# Patient Record
Sex: Female | Born: 1965 | Hispanic: No | Marital: Married | Smoking: Never smoker
Health system: Southern US, Community
[De-identification: ages and names within clinical notes are randomized; demographics above are authoritative.]

## PROBLEM LIST (undated history)

## (undated) DIAGNOSIS — C4490 Unspecified malignant neoplasm of skin, unspecified: Secondary | ICD-10-CM

## (undated) DIAGNOSIS — D649 Anemia, unspecified: Secondary | ICD-10-CM

## (undated) DIAGNOSIS — G43909 Migraine, unspecified, not intractable, without status migrainosus: Secondary | ICD-10-CM

## (undated) HISTORY — PX: WISDOM TOOTH EXTRACTION: SHX21

## (undated) HISTORY — PX: CHOLECYSTECTOMY: SHX55

## (undated) HISTORY — DX: Unspecified malignant neoplasm of skin, unspecified: C44.90

## (undated) HISTORY — DX: Anemia, unspecified: D64.9

---

## 1997-10-31 ENCOUNTER — Other Ambulatory Visit: Admission: RE | Admit: 1997-10-31 | Discharge: 1997-10-31 | Payer: Self-pay | Admitting: Obstetrics and Gynecology

## 1998-04-24 ENCOUNTER — Other Ambulatory Visit: Admission: RE | Admit: 1998-04-24 | Discharge: 1998-04-24 | Payer: Self-pay | Admitting: Obstetrics and Gynecology

## 1998-12-28 ENCOUNTER — Other Ambulatory Visit: Admission: RE | Admit: 1998-12-28 | Discharge: 1998-12-28 | Payer: Self-pay | Admitting: Obstetrics and Gynecology

## 1999-10-26 ENCOUNTER — Encounter: Payer: Self-pay | Admitting: Emergency Medicine

## 1999-10-26 ENCOUNTER — Emergency Department (HOSPITAL_COMMUNITY): Admission: EM | Admit: 1999-10-26 | Discharge: 1999-10-26 | Payer: Self-pay | Admitting: Emergency Medicine

## 2000-01-29 ENCOUNTER — Other Ambulatory Visit: Admission: RE | Admit: 2000-01-29 | Discharge: 2000-01-29 | Payer: Self-pay | Admitting: Obstetrics and Gynecology

## 2001-03-19 ENCOUNTER — Other Ambulatory Visit: Admission: RE | Admit: 2001-03-19 | Discharge: 2001-03-19 | Payer: Self-pay | Admitting: Obstetrics and Gynecology

## 2002-06-02 ENCOUNTER — Other Ambulatory Visit: Admission: RE | Admit: 2002-06-02 | Discharge: 2002-06-02 | Payer: Self-pay | Admitting: Obstetrics and Gynecology

## 2003-06-14 ENCOUNTER — Other Ambulatory Visit: Admission: RE | Admit: 2003-06-14 | Discharge: 2003-06-14 | Payer: Self-pay | Admitting: Obstetrics and Gynecology

## 2004-07-13 ENCOUNTER — Other Ambulatory Visit: Admission: RE | Admit: 2004-07-13 | Discharge: 2004-07-13 | Payer: Self-pay | Admitting: Obstetrics and Gynecology

## 2006-08-21 ENCOUNTER — Encounter: Admission: RE | Admit: 2006-08-21 | Discharge: 2006-08-21 | Payer: Self-pay | Admitting: Internal Medicine

## 2014-03-25 ENCOUNTER — Emergency Department (HOSPITAL_BASED_OUTPATIENT_CLINIC_OR_DEPARTMENT_OTHER)
Admission: EM | Admit: 2014-03-25 | Discharge: 2014-03-25 | Disposition: A | Discharge status: Home / Self Care | Payer: BLUE CROSS/BLUE SHIELD | Attending: Emergency Medicine | Admitting: Emergency Medicine

## 2014-03-25 ENCOUNTER — Encounter (HOSPITAL_BASED_OUTPATIENT_CLINIC_OR_DEPARTMENT_OTHER): Payer: Self-pay | Admitting: *Deleted

## 2014-03-25 DIAGNOSIS — R51 Headache: Secondary | ICD-10-CM | POA: Diagnosis present

## 2014-03-25 DIAGNOSIS — B029 Zoster without complications: Secondary | ICD-10-CM

## 2014-03-25 HISTORY — DX: Migraine, unspecified, not intractable, without status migrainosus: G43.909

## 2014-03-25 MED ORDER — OXYCODONE-ACETAMINOPHEN 5-325 MG PO TABS: 1.0000 | ORAL_TABLET | Freq: Four times a day (QID) | ORAL | Status: DC | PRN

## 2014-03-25 NOTE — Discharge Instructions (Signed)
Percocet as prescribed as needed for pain.  Continue your steroid and valacyclovir as prescribed.  You can stop taking the clindamycin.  Return to the emergency department if your symptoms significantly worsen or change.   Shingles Shingles (herpes zoster) is an infection that is caused by the same virus that causes chickenpox (varicella). The infection causes a painful skin rash and fluid-filled blisters, which eventually break open, crust over, and heal. It may occur in any area of the body, but it usually affects only one side of the body or face. The pain of shingles usually lasts about 1 month. However, some people with shingles may develop long-term (chronic) pain in the affected area of the body. Shingles often occurs many years after the person had chickenpox. It is more common:  In people older than 50 years.  In people with weakened immune systems, such as those with HIV, AIDS, or cancer.  In people taking medicines that weaken the immune system, such as transplant medicines.  In people under great stress. CAUSES  Shingles is caused by the varicella zoster virus (VZV), which also causes chickenpox. After a person is infected with the virus, it can remain in the person's body for years in an inactive state (dormant). To cause shingles, the virus reactivates and breaks out as an infection in a nerve root. The virus can be spread from person to person (contagious) through contact with open blisters of the shingles rash. It will only spread to people who have not had chickenpox. When these people are exposed to the virus, they may develop chickenpox. They will not develop shingles. Once the blisters scab over, the person is no longer contagious and cannot spread the virus to others. SIGNS AND SYMPTOMS  Shingles shows up in stages. The initial symptoms may be pain, itching, and tingling in an area of the skin. This pain is usually described as burning, stabbing, or throbbing.In a few days  or weeks, a painful red rash will appear in the area where the pain, itching, and tingling were felt. The rash is usually on one side of the body in a band or belt-like pattern. Then, the rash usually turns into fluid-filled blisters. They will scab over and dry up in approximately 2-3 weeks. Flu-like symptoms may also occur with the initial symptoms, the rash, or the blisters. These may include:  Fever.  Chills.  Headache.  Upset stomach. DIAGNOSIS  Your health care provider will perform a skin exam to diagnose shingles. Skin scrapings or fluid samples may also be taken from the blisters. This sample will be examined under a microscope or sent to a lab for further testing. TREATMENT  There is no specific cure for shingles. Your health care provider will likely prescribe medicines to help you manage the pain, recover faster, and avoid long-term problems. This may include antiviral drugs, anti-inflammatory drugs, and pain medicines. HOME CARE INSTRUCTIONS   Take a cool bath or apply cool compresses to the area of the rash or blisters as directed. This may help with the pain and itching.   Take medicines only as directed by your health care provider.   Rest as directed by your health care provider.  Keep your rash and blisters clean with mild soap and cool water or as directed by your health care provider.  Do not pick your blisters or scratch your rash. Apply an anti-itch cream or numbing creams to the affected area as directed by your health care provider.  Keep your shingles rash  covered with a loose bandage (dressing).  Avoid skin contact with:  Babies.   Pregnant women.   Children with eczema.   Elderly people with transplants.   People with chronic illnesses, such as leukemia or AIDS.   Wear loose-fitting clothing to help ease the pain of material rubbing against the rash.  Keep all follow-up visits as directed by your health care provider.If the area involved  is on your face, you may receive a referral for a specialist, such as an eye doctor (ophthalmologist) or an ear, nose, and throat (ENT) doctor. Keeping all follow-up visits will help you avoid eye problems, chronic pain, or disability.  SEEK IMMEDIATE MEDICAL CARE IF:   You have facial pain, pain around the eye area, or loss of feeling on one side of your face.  You have ear pain or ringing in your ear.  You have loss of taste.  Your pain is not relieved with prescribed medicines.   Your redness or swelling spreads.   You have more pain and swelling.  Your condition is worsening or has changed.   You have a fever. MAKE SURE YOU:  Understand these instructions.  Will watch your condition.  Will get help right away if you are not doing well or get worse. Document Released: 02/25/2005 Document Revised: 07/12/2013 Document Reviewed: 10/10/2011 [facility] Patient Information 2015 [place], [place]. This information is not intended to replace advice given to you by your health care provider. Make sure you discuss any questions you have with your health care provider.

## 2014-03-25 NOTE — ED Provider Notes (Signed)
CSN: [id number]     Arrival date & time 03/25/14  9480 History   First MD Initiated Contact with Patient 03/25/14 [phone]     Chief Complaint  Patient presents with  . Facial Pain     (Consider location/radiation/quality/duration/timing/severity/associated sxs/prior Treatment) HPI Comments: Patient is a 49 year old female, otherwise healthy who presents with complaints of right facial and temporal pain. She was seen by her ENT 3 days ago and was diagnosed with shingles due to a rash to her right temple. She was prescribed prednisone, Valtrex, and pain medication. She presents here due to ongoing pain that is unrelieved with the pain medication she was prescribed. She denies any fevers or chills. She denies any visual disturbances. She denies any nausea, vomiting.  The history is provided by the patient.    No past medical history on file. No past surgical history on file. No family history on file. History  Substance Use Topics  . Smoking status: Never Smoker   . Smokeless tobacco: Never Used  . Alcohol Use: No   OB History    No data available     Review of Systems  All other systems reviewed and are negative.     Allergies  Review of patient's allergies indicates not on file.  Home Medications   Prior to Admission medications   Not on File   BP 123/80 mmHg  Pulse 81  Temp(Src) 98.3 F (36.8 C) (Oral)  Resp 18  Ht 5\' 6"  (1.676 m)  Wt 112 lb (50.803 kg)  BMI 18.09 kg/m2  SpO2 98% Physical Exam  Constitutional: She is oriented to person, place, and time. She appears well-developed and well-nourished. No distress.  HENT:  Head: Normocephalic and atraumatic.  Right Ear: External ear normal.  Left Ear: External ear normal.  Mouth/Throat: Oropharynx is clear and moist.  There is a vesicular rash present to the right zygoma and lateral to the right eye, but not involving the eye.  Eyes: EOM are normal. Pupils are equal, round, and reactive to light.  Neck: Normal  range of motion. Neck supple.  Musculoskeletal: Normal range of motion. She exhibits no edema.  Lymphadenopathy:    She has no cervical adenopathy.  Neurological: She is alert and oriented to person, place, and time. No cranial nerve deficit. She exhibits normal muscle tone. Coordination normal.  Skin: Skin is warm and dry. She is not diaphoretic.  Nursing note and vitals reviewed.   ED Course  Procedures (including critical care time) Labs Review Labs Reviewed - No data to display  Imaging Review No results found.   EKG Interpretation None      MDM   Final diagnoses:  None    As the patient's hydrocodone is not relieving her pain, I will prescribe Percocet which she can take as needed. She is to continue the Valtrex and prednisone. She was prescribed clindamycin which I believe she can stop as this is does not appear to be a cellulitis or dental in nature.    Veryl Speak, MD 03/25/14 [phone]

## 2014-03-25 NOTE — ED Notes (Signed)
Rx x 1 given for percocet

## 2014-03-25 NOTE — ED Notes (Addendum)
Facial pain since Monday- saw dentist and ENT- dx with shingles and started on multiple meds- slight rash by right eye and on chin

## 2014-03-25 NOTE — ED Notes (Signed)
MD at bedside. 

## 2015-06-02 ENCOUNTER — Encounter: Payer: Self-pay | Admitting: Internal Medicine

## 2015-07-20 ENCOUNTER — Ambulatory Visit (AMBULATORY_SURGERY_CENTER): Payer: Self-pay

## 2015-07-20 VITALS — Ht 65.5 in | Wt 116.4 lb

## 2015-07-20 DIAGNOSIS — Z1211 Encounter for screening for malignant neoplasm of colon: Secondary | ICD-10-CM

## 2015-07-20 NOTE — Progress Notes (Signed)
No allergies to eggs or soy No past problems with anesthesia No diet meds No home oxygen  Has email and internet registered for emmi 

## 2015-07-21 ENCOUNTER — Encounter: Payer: Self-pay | Admitting: Internal Medicine

## 2015-08-03 ENCOUNTER — Ambulatory Visit (AMBULATORY_SURGERY_CENTER): Payer: BLUE CROSS/BLUE SHIELD | Admitting: Internal Medicine

## 2015-08-03 ENCOUNTER — Encounter: Payer: Self-pay | Admitting: Internal Medicine

## 2015-08-03 VITALS — BP 110/70 | HR 57 | Temp 98.6°F | Resp 12 | Ht 65.0 in | Wt 116.0 lb

## 2015-08-03 DIAGNOSIS — Z1211 Encounter for screening for malignant neoplasm of colon: Secondary | ICD-10-CM | POA: Diagnosis not present

## 2015-08-03 MED ORDER — SODIUM CHLORIDE 0.9 % IV SOLN: 500.0000 mL | INTRAVENOUS | Status: DC

## 2015-08-03 NOTE — Op Note (Signed)
[place] Patient Name: Felicia Barron Procedure Date: 08/03/2015 9:27 AM MRN: [id number] Endoscopist: Gatha Mayer , MD Age: 50 Referring MD:  Date of Birth: 1966-02-05 Gender: Female Procedure:                Colonoscopy Indications:              Screening for colorectal malignant neoplasm, This                            is the patient's first colonoscopy Medicines:                Propofol per Anesthesia, Monitored Anesthesia Care Procedure:                Pre-Anesthesia Assessment:                           - Prior to the procedure, a History and Physical                            was performed, and patient medications and                            allergies were reviewed. The patient's tolerance of                            previous anesthesia was also reviewed. The risks                            and benefits of the procedure and the sedation                            options and risks were discussed with the patient.                            All questions were answered, and informed consent                            was obtained. Prior Anticoagulants: The patient has                            taken no previous anticoagulant or antiplatelet                            agents. ASA Grade Assessment: II - A patient with                            mild systemic disease. After reviewing the risks                            and benefits, the patient was deemed in                            satisfactory condition to undergo the procedure.  After obtaining informed consent, the colonoscope                            was passed under direct vision. Throughout the                            procedure, the patient's blood pressure, pulse, and                            oxygen saturations were monitored continuously. The                            Model CF-HQ190L [id number]) scope was introduced                            through the anus  and advanced to the the cecum,                            identified by appendiceal orifice and ileocecal                            valve. The ileocecal valve, appendiceal orifice,                            and rectum were photographed. The quality of the                            bowel preparation was good. The colonoscopy was                            performed without difficulty. The patient tolerated                            the procedure well. The bowel preparation used was                            Miralax. Scope In: 9:30:49 AM Scope Out: 9:43:35 AM Scope Withdrawal Time: 0 hours 8 minutes 14 seconds  Total Procedure Duration: 0 hours 12 minutes 46 seconds  Findings:                 The colon (entire examined portion) appeared normal.                           The perianal and digital rectal examinations were                            normal. Complications:            No immediate complications. Estimated blood loss:                            None. Estimated Blood Loss:     Estimated blood loss: none. Recommendation:           - Repeat colonoscopy in 10 years for screening  purposes.                           - Patient has a contact number available for                            emergencies. The signs and symptoms of potential                            delayed complications were discussed with the                            patient. Return to normal activities tomorrow.                            Written discharge instructions were provided to the                            patient.                           - Resume previous diet.                           - Continue present medications. Gatha Mayer, MD 08/03/2015 9:46:46 AM This report has been signed electronically.

## 2015-08-03 NOTE — Patient Instructions (Addendum)
   Your colonoscopy was normal!  Next routine colonoscopy in 10 years - 2027  I appreciate the opportunity to care for you. Gatha Mayer, MD, FACG  YOU HAD AN ENDOSCOPIC PROCEDURE TODAY AT [facility]:   Refer to the procedure report that was given to you for any specific questions about what was found during the examination.  If the procedure report does not answer your questions, please call your gastroenterologist to clarify.  If you requested that your care partner not be given the details of your procedure findings, then the procedure report has been included in a sealed envelope for you to review at your convenience later.  YOU SHOULD EXPECT: Some feelings of bloating in the abdomen. Passage of more gas than usual.  Walking can help get rid of the air that was put into your GI tract during the procedure and reduce the bloating. If you had a lower endoscopy (such as a colonoscopy or flexible sigmoidoscopy) you may notice spotting of blood in your stool or on the toilet paper. If you underwent a bowel prep for your procedure, you may not have a normal bowel movement for a few days.  Please Note:  You might notice some irritation and congestion in your nose or some drainage.  This is from the oxygen used during your procedure.  There is no need for concern and it should clear up in a day or so.  SYMPTOMS TO REPORT IMMEDIATELY:   Following lower endoscopy (colonoscopy or flexible sigmoidoscopy):  Excessive amounts of blood in the stool  Significant tenderness or worsening of abdominal pains  Swelling of the abdomen that is new, acute  Fever of 100F or higher  For urgent or emergent issues, a gastroenterologist can be reached at any hour by calling [phone].  DIET: Your first meal following the procedure should be a small meal and then it is ok to progress to your normal diet. Heavy or fried foods are harder to digest and may make you feel nauseous or  bloated.  Likewise, meals heavy in dairy and vegetables can increase bloating.  Drink plenty of fluids but you should avoid alcoholic beverages for 24 hours.  ACTIVITY:  You should plan to take it easy for the rest of today and you should NOT DRIVE or use heavy machinery until tomorrow (because of the sedation medicines used during the test).    FOLLOW UP: Our staff will call the number listed on your records the next business day following your procedure to check on you and address any questions or concerns that you may have regarding the information given to you following your procedure. If we do not reach you, we will leave a message.  However, if you are feeling well and you are not experiencing any problems, there is no need to return our call.  We will assume that you have returned to your regular daily activities without incident.  SIGNATURES/CONFIDENTIALITY: You and/or your care partner have signed paperwork which will be entered into your electronic medical record.  These signatures attest to the fact that that the information above on your After Visit Summary has been reviewed and is understood.  Full responsibility of the confidentiality of this discharge information lies with you and/or your care-partner.  Next colonoscopy- 10 years  Please continue your normal medications

## 2015-08-03 NOTE — Progress Notes (Signed)
Report to PACU, RN, vss, BBS= Clear.  

## 2015-08-04 ENCOUNTER — Telehealth: Payer: Self-pay | Admitting: *Deleted

## 2015-08-04 NOTE — Telephone Encounter (Signed)
  Follow up Call-  Call back number 08/03/2015  Post procedure Call Back phone  # [phone]  Permission to leave phone message Yes     Patient questions:  Do you have a fever, pain , or abdominal swelling? No. Pain Score  0 *  Have you tolerated food without any problems? Yes.    Have you been able to return to your normal activities? Yes.    Do you have any questions about your discharge instructions: Diet   No. Medications  No. Follow up visit  No.  Do you have questions or concerns about your Care? No.  Actions: * If pain score is 4 or above: No action needed, pain <4.

## 2015-10-02 ENCOUNTER — Other Ambulatory Visit: Payer: Self-pay | Admitting: Obstetrics and Gynecology

## 2015-10-02 DIAGNOSIS — R928 Other abnormal and inconclusive findings on diagnostic imaging of breast: Secondary | ICD-10-CM

## 2015-10-03 ENCOUNTER — Other Ambulatory Visit: Payer: Self-pay | Admitting: Obstetrics and Gynecology

## 2015-10-03 DIAGNOSIS — R928 Other abnormal and inconclusive findings on diagnostic imaging of breast: Secondary | ICD-10-CM

## 2015-10-09 ENCOUNTER — Ambulatory Visit
Admission: RE | Admit: 2015-10-09 | Discharge: 2015-10-09 | Disposition: A | Discharge status: Home / Self Care | Payer: BLUE CROSS/BLUE SHIELD | Source: Ambulatory Visit | Attending: Obstetrics and Gynecology | Admitting: Obstetrics and Gynecology

## 2015-10-09 DIAGNOSIS — R928 Other abnormal and inconclusive findings on diagnostic imaging of breast: Secondary | ICD-10-CM

## 2015-10-09 IMAGING — US ULTRASOUND RIGHT BREAST LIMITED
1 series · 13 of 14 positions shown · non-contrast
Comparison: Previous exam(s).

CLINICAL DATA: Callback for possible right breast asymmetries

EXAM:
2D DIGITAL DIAGNOSTIC RIGHT MAMMOGRAM WITH CAD AND ADJUNCT TOMO
ULTRASOUND RIGHT BREAST

[Series 1: ultrasound right breast limited · 0.03mm/px · 13 of 14 slices shown]
[im 1/14]
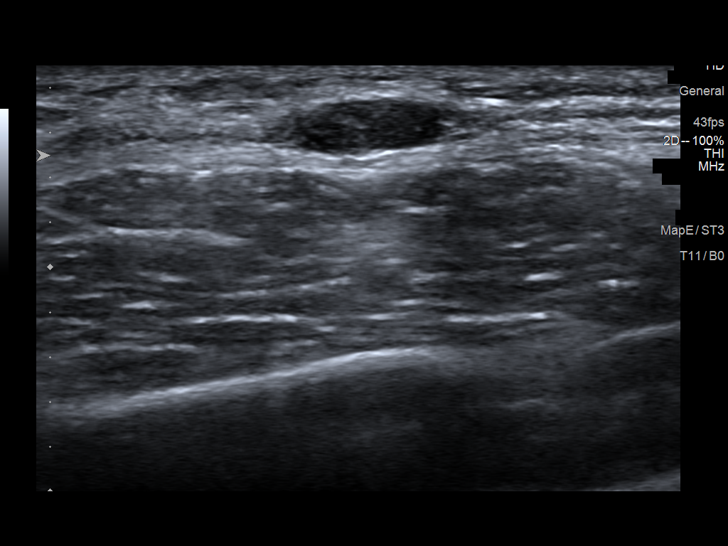
[im 2/14]
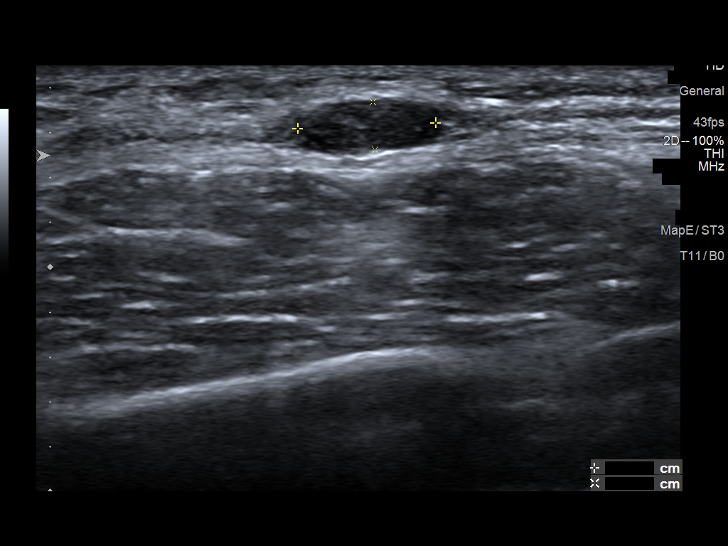
[im 3/14]
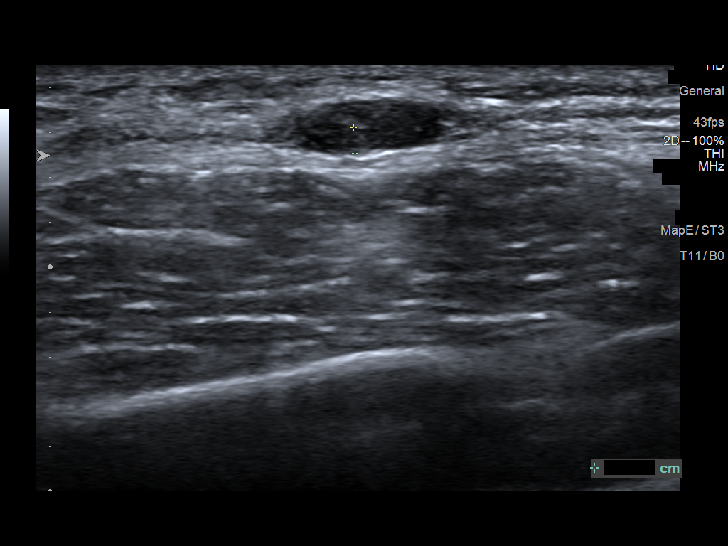
[im 4/14]
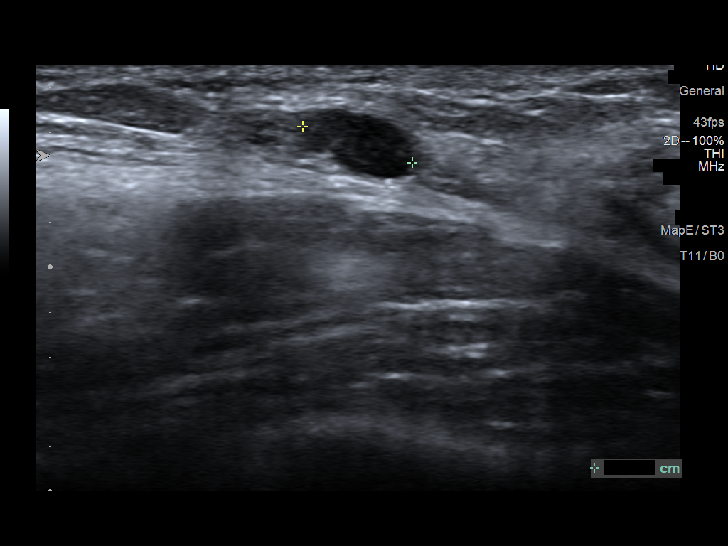
[im 5/14]
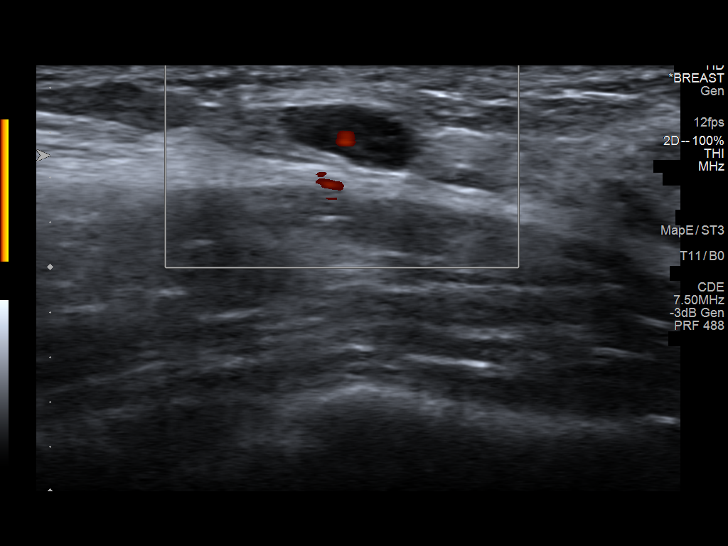
[im 6/14]
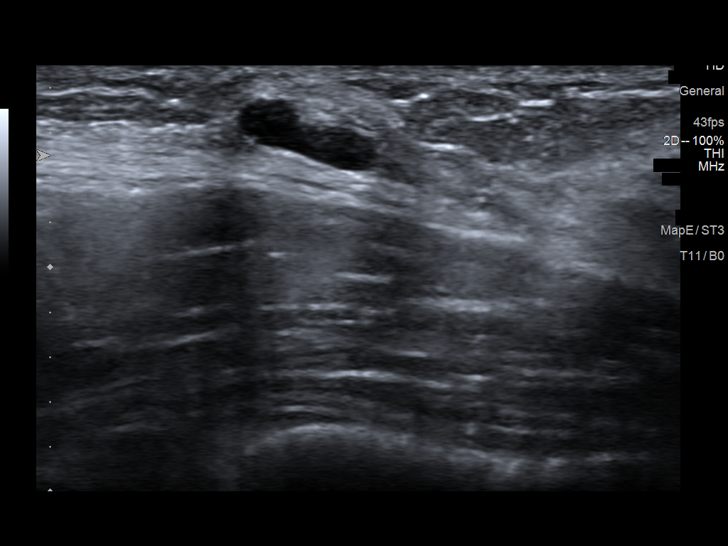
[im 8/14]
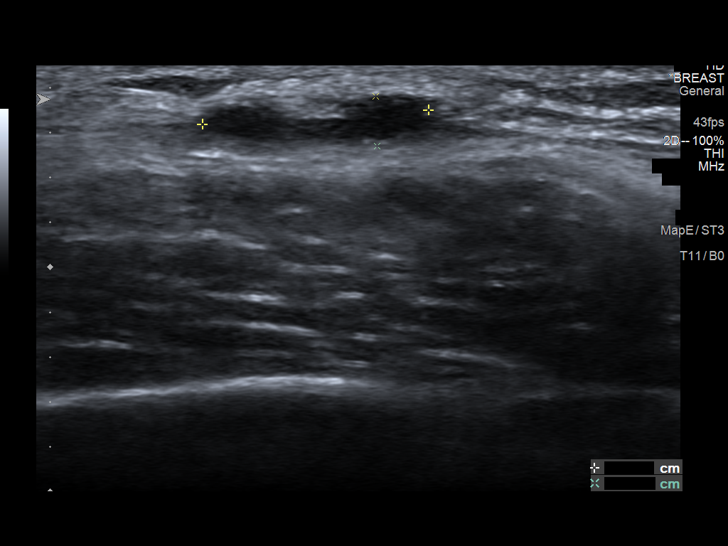
[im 9/14]
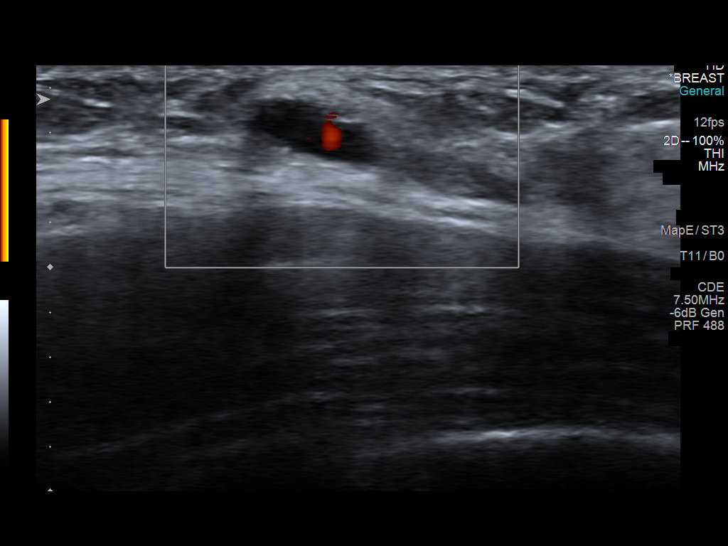
[im 10/14]
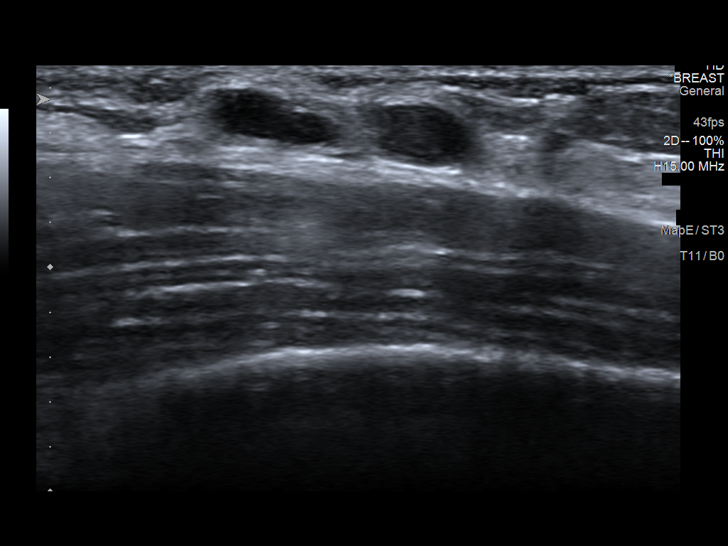
[im 11/14]
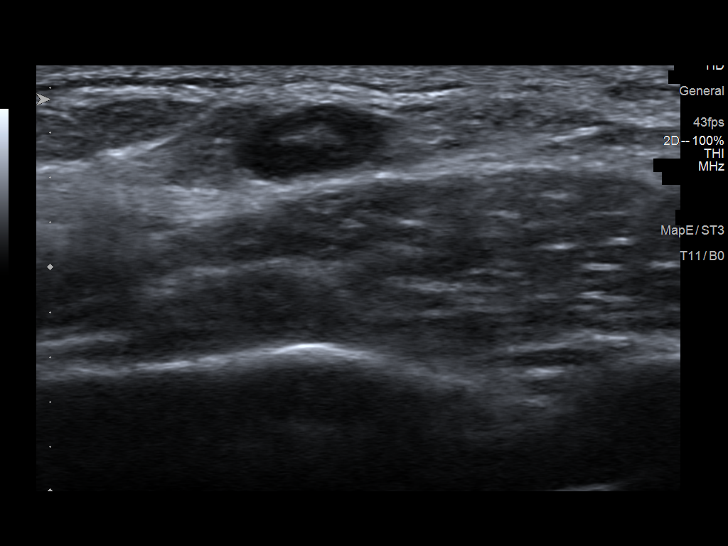
[im 12/14]
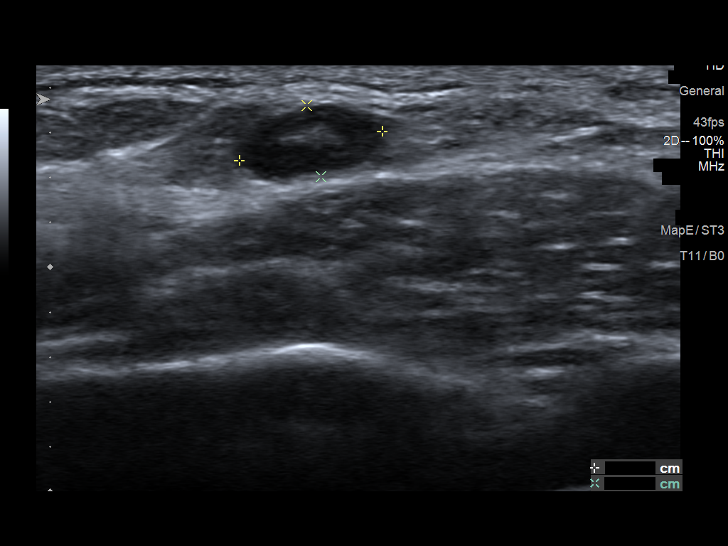
[im 13/14]
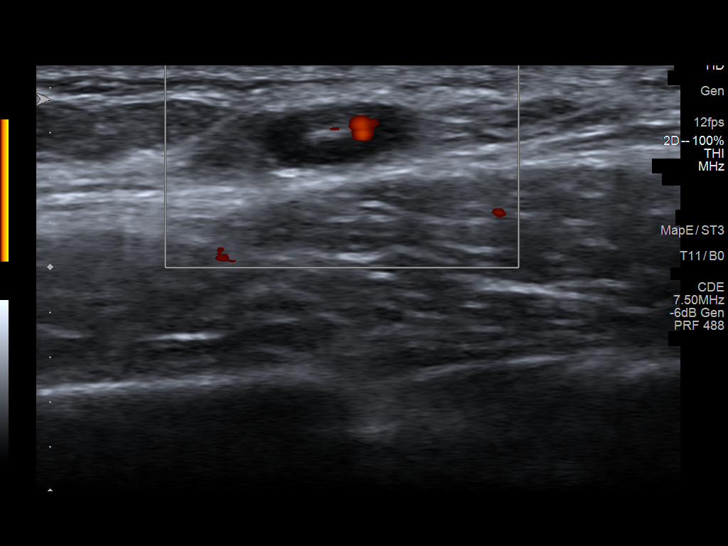
[im 14/14]
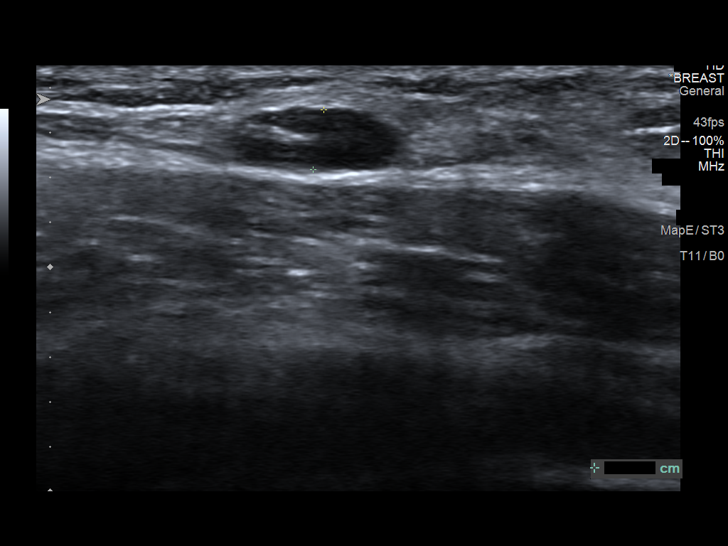

[13 of 14 positions shown; findings below may reference images not displayed]

ACR Breast Density Category d: The breast tissue is extremely dense,
which lowers the sensitivity of mammography.
FINDINGS: Cc, MLO, and spot-compression views of the right breast were
performed with tomosynthesis. On the additional views, there are 2
oval, circumscribed masses with the suggestion of central fatty hila
within the posterior, superior right breast. There is an additional
partially imaged oval mass within the posterior, superior right
breast, the visualized margins of which are circumscribed.

Mammographic images were processed with CAD.

Targeted ultrasound was performed demonstrating 3 normal appearing
intramammary lymph nodes including a 6 mm lymph node at 9 o'clock, 6
cm from the nipple, a second 10 mm lymph node at 9 o'clock, 6 cm
from the nipple, and a 7 mm lymph node at 10 o'clock, 7 cm from the
nipple. These lymph nodes correspond to the oval masses seen
mammographically. No suspicious sonographic finding is identified.
IMPRESSION: No mammographic or sonographic evidence of malignancy.

RECOMMENDATION:
Screening mammogram in one year.(Code:[PE])

I have discussed the findings and recommendations with the patient.
Results were also provided in writing at the conclusion of the
visit. If applicable, a reminder letter will be sent to the patient
regarding the next appointment.

BI-RADS CATEGORY  2: Benign.

## 2016-10-08 ENCOUNTER — Other Ambulatory Visit: Payer: Self-pay | Admitting: Obstetrics and Gynecology

## 2016-10-08 DIAGNOSIS — R928 Other abnormal and inconclusive findings on diagnostic imaging of breast: Secondary | ICD-10-CM

## 2016-10-15 ENCOUNTER — Ambulatory Visit
Admission: RE | Admit: 2016-10-15 | Discharge: 2016-10-15 | Disposition: A | Discharge status: Home / Self Care | Payer: Managed Care, Other (non HMO) | Source: Ambulatory Visit | Attending: Obstetrics and Gynecology | Admitting: Obstetrics and Gynecology

## 2016-10-15 DIAGNOSIS — R928 Other abnormal and inconclusive findings on diagnostic imaging of breast: Secondary | ICD-10-CM

## 2019-10-14 ENCOUNTER — Ambulatory Visit: Payer: BC Managed Care – PPO | Attending: Family Medicine

## 2019-10-14 ENCOUNTER — Other Ambulatory Visit: Payer: Self-pay

## 2019-10-14 DIAGNOSIS — M25612 Stiffness of left shoulder, not elsewhere classified: Secondary | ICD-10-CM | POA: Diagnosis present

## 2019-10-14 DIAGNOSIS — S46912A Strain of unspecified muscle, fascia and tendon at shoulder and upper arm level, left arm, initial encounter: Secondary | ICD-10-CM | POA: Diagnosis not present

## 2019-10-14 DIAGNOSIS — M6281 Muscle weakness (generalized): Secondary | ICD-10-CM | POA: Insufficient documentation

## 2019-10-14 DIAGNOSIS — M25512 Pain in left shoulder: Secondary | ICD-10-CM | POA: Diagnosis present

## 2019-10-14 NOTE — Therapy (Signed)
[place], [place], [address] Phone: [phone]   Fax:  [phone]  Physical Therapy Evaluation  Patient Details  Name: Felicia Barron MRN: [id number] Date of Birth: [birth year] Referring Provider (PT): Bernerd Limbo, MD   Encounter Date: 10/14/2019   PT End of Session - 10/14/19 0944    Visit Number 1    Number of Visits 12    Date for PT Re-Evaluation 11/25/19    Authorization Type BCBS    PT Start Time 0917    PT Stop Time 1002    PT Time Calculation (min) 45 min    Activity Tolerance Patient tolerated treatment well    Behavior During Therapy [facility] for tasks assessed/performed           Past Medical History:  Diagnosis Date  . Anemia   . Migraine   . Skin cancer     Past Surgical History:  Procedure Laterality Date  . CESAREAN SECTION    . CHOLECYSTECTOMY    . WISDOM TOOTH EXTRACTION      There were no vitals filed for this visit.    Subjective Assessment - 10/14/19 0928    Subjective Pt reports she initially noticed L shoulder aching 6 weeks ago and then improved. However, pt was helping husband with a roof bag for vacation on top of car. She then started aching again and mobility has continually worsened. MD gave pt CSI yesterday.    Pertinent History R frozen shoulder 2016    Limitations Lifting;House hold activities;Other (comment)   reaching behind back, overhead; seat belt, bra don/doff   Diagnostic tests XR - negative    Patient Stated Goals to put on my bra, to be able to perform basic ADLs dressing/grooming with no help from husband    Currently in Pain? Yes    Pain Score 0-No pain   at times 10/10   Pain Location Shoulder    Pain Orientation Left    Pain Radiating Towards elbow    Pain Onset 1 to 4 weeks ago    Pain Frequency Intermittent    Aggravating Factors  cannot don/doff bra, prolonged overhead like washing hair, dressing, grooming, wakes up 3-4x/night  with pain    Pain Relieving Factors Advil PM at night sleep              [facility] PT Assessment - 10/14/19 0001      Assessment   Medical Diagnosis Left Shoulder Strain    Referring Provider (PT) Bernerd Limbo, MD    Onset Date/Surgical Date 09/23/19    Hand Dominance Right    Next MD Visit PRN    Prior Therapy Yes 2016      Balance Screen   Has the patient fallen in the past 6 months No    Has the patient had a decrease in activity level because of a fear of falling?  Yes    Is the patient reluctant to leave their home because of a fear of falling?  No      Prior Function   Leisure Exercise, go to the beach, spend time with family, knit      ROM / Strength   AROM / PROM / Strength AROM;PROM;Strength      AROM   AROM Assessment Site Shoulder    Right/Left Shoulder Right;Left    Right Shoulder Flexion 145 Degrees    Right Shoulder ABduction 165 Degrees    Right Shoulder Internal  Rotation --   T8   Right Shoulder External Rotation --   T3   Left Shoulder Flexion 123 Degrees   pain   Left Shoulder ABduction 133 Degrees   pain   Left Shoulder Internal Rotation --   T12   Left Shoulder External Rotation --   T2     PROM   PROM Assessment Site Shoulder    Right/Left Shoulder Right;Left    Right Shoulder Flexion 150 Degrees    Right Shoulder ABduction 160 Degrees    Right Shoulder Internal Rotation 67 Degrees    Right Shoulder External Rotation 95 Degrees    Left Shoulder Flexion 135 Degrees   pain   Left Shoulder ABduction 98 Degrees    Left Shoulder Internal Rotation 55 Degrees    Left Shoulder External Rotation 50 Degrees      Strength   Strength Assessment Site Shoulder    Right/Left Shoulder Right;Left    Right Shoulder Flexion 4/5    Right Shoulder ABduction 4/5    Right Shoulder Internal Rotation 4/5    Right Shoulder External Rotation 3+/5    Left Shoulder Flexion 4-/5   pain   Left Shoulder ABduction 4-/5    Left Shoulder Internal Rotation 4/5    Left  Shoulder External Rotation 4-/5      Palpation   Palpation comment Pain with all ranging of L shoulder, decreased posterior and inferior GHJ glides                      Objective measurements completed on examination: See above findings.               PT Education - 10/14/19 1331    Education Details Diagnosis, Prognosis, POC, HEP    Person(s) Educated Patient    Methods Explanation;Handout    Comprehension Returned demonstration;Verbalized understanding            PT Short Term Goals - 10/14/19 1350      PT SHORT TERM GOAL #1   Title Pt will be independent and compliant with initial HEP.    Time 1    Period Weeks    Status New    Target Date 10/21/19      PT SHORT TERM GOAL #2   Title Pt will increase shoulder flexion/abduction ROM at least 10 each.    Time 3    Period Weeks    Status New    Target Date 11/04/19      PT SHORT TERM GOAL #3   Title Pt will only wake up 1-2x/night secondary to L shoulder pain.    Baseline 3-4x    Time 3    Period Weeks    Status New    Target Date 11/04/19             PT Long Term Goals - 10/14/19 1351      PT LONG TERM GOAL #1   Title Pt will decrease FOTO disability to 37%, demonstrating a significant change in perceived functional ability.    Baseline 66% disability    Time 6    Period Weeks    Status New    Target Date 11/25/19      PT LONG TERM GOAL #2   Title Pt will increase true flexion/abduction ROM to >130 with <2/10 pain at most.    Time 6    Period Weeks    Status New    Target Date 11/25/19  PT LONG TERM GOAL #3   Title Pt will increase L shoulder MMT to 4+/5 without pain.    Time 6    Period Weeks    Status New    Target Date 11/25/19      PT LONG TERM GOAL #4   Title Pt will be able to perform all ADLs independently, including don/doff bra and zipping up shirts/dresses.    Baseline husband helps    Time 6    Period Weeks    Status New    Target Date 11/25/19        PT LONG TERM GOAL #5   Title Pt will sleep through the night without being awoken with L shoulder pain.    Time 6    Period Weeks    Status New    Target Date 11/25/19                  Plan - 10/14/19 1340    Clinical Impression Statement Pt is a 54 yo female who presents after onset of L shoulder pain following overhead lifting. Pt did not report pain initially, but the next day began to feel stiffness that has gradually progressed. Pt is unable to don/doff bra or zipper for dresses, and she additionally wakes up 3-4x/night due to L shoulder pain. She demonstrates decreased ROM and strength, thoracic hypomobility, and pain --> potentially indicative of adhesive capsulitis. Pt was educated on diagnosis, prognosis, POC and HEP verbalizing understanding and consent to tx. Pt will benefit from skilled physical therapy 2x/week for 6 weeks to address impairments and restore shoulder function.    Personal Factors and Comorbidities Age;Past/Current Experience;Sex    Examination-Activity Limitations Bathing;Reach Overhead;Sleep;Carry;Lift    Examination-Participation Restrictions Driving;Interpersonal Relationship;Cleaning    Stability/Clinical Decision Making Stable/Uncomplicated    Clinical Decision Making Low    Rehab Potential Good    PT Frequency 2x / week    PT Duration 6 weeks    PT Treatment/Interventions ADLs/Self Care Home Management;Joint Manipulations;Spinal Manipulations;Vasopneumatic Device;Manual techniques;Passive range of motion;Patient/family education;Neuromuscular re-education;Therapeutic activities;Therapeutic exercise;Cryotherapy;Moist Heat    PT Next Visit Plan Assess HEP, progress pec flexibility, shoulder ROM, postural alignment; manual to address soft tissue/joint restrictions and increase ROM    PT Home Exercise Plan [id number]: supine AAROM shoulder flexion, standing ER at 0 S in doorway, pec S at 102 in doorway    Consulted and Agree with Plan of Care Patient            Patient will benefit from skilled therapeutic intervention in order to improve the following deficits and impairments:  Decreased mobility, Decreased strength, Impaired flexibility, Hypomobility, Impaired perceived functional ability, Pain, Impaired UE functional use, Decreased range of motion, Increased fascial restricitons  Visit Diagnosis: Strain of left shoulder, initial encounter - Plan: PT plan of care cert/re-cert  Stiffness of left shoulder, not elsewhere classified - Plan: PT plan of care cert/re-cert  Acute pain of left shoulder - Plan: PT plan of care cert/re-cert  Muscle weakness (generalized) - Plan: PT plan of care cert/re-cert     Problem List There are no problems to display for this patient.   Izell [place], PT, DPT 10/14/2019, 2:00 PM  [place] [address] [place], [place], [address] Phone: [phone]   Fax:  [phone]  Name: Felicia Barron MRN: [id number] Date of Birth: 02-May-1965

## 2019-10-18 ENCOUNTER — Ambulatory Visit: Payer: BC Managed Care – PPO

## 2019-10-18 ENCOUNTER — Ambulatory Visit: Payer: Managed Care, Other (non HMO)

## 2019-10-18 ENCOUNTER — Other Ambulatory Visit: Payer: Self-pay

## 2019-10-18 DIAGNOSIS — M25612 Stiffness of left shoulder, not elsewhere classified: Secondary | ICD-10-CM

## 2019-10-18 DIAGNOSIS — M6281 Muscle weakness (generalized): Secondary | ICD-10-CM

## 2019-10-18 DIAGNOSIS — S46912A Strain of unspecified muscle, fascia and tendon at shoulder and upper arm level, left arm, initial encounter: Secondary | ICD-10-CM | POA: Diagnosis not present

## 2019-10-18 DIAGNOSIS — M25512 Pain in left shoulder: Secondary | ICD-10-CM

## 2019-10-18 NOTE — Therapy (Signed)
[place], [place], [address] Phone: [phone]   Fax:  [phone]  Physical Therapy Treatment  Patient Details  Name: Felicia Barron MRN: [id number] Date of Birth: 10-21-1965 Referring Provider (PT): Bernerd Limbo, MD   Encounter Date: 10/18/2019   PT End of Session - 10/18/19 1107    Visit Number 2    Number of Visits 12    Date for PT Re-Evaluation 11/25/19    Authorization Type BCBS    PT Start Time 1104    PT Stop Time 1145    PT Time Calculation (min) 41 min    Activity Tolerance Patient tolerated treatment well    Behavior During Therapy [facility] for tasks assessed/performed           Past Medical History:  Diagnosis Date  . Anemia   . Migraine   . Skin cancer     Past Surgical History:  Procedure Laterality Date  . CESAREAN SECTION    . CHOLECYSTECTOMY    . WISDOM TOOTH EXTRACTION      There were no vitals filed for this visit.   Subjective Assessment - 10/18/19 1110    Subjective Pt reports she has been doing her exercises 3-6x/day. She has not been able to get comfortable the last couple of days.  She says it felt good on Friday and Saturday, so she mopped her floors and washed her, thinks she may have overdone it    Pertinent History R frozen shoulder 2016    Limitations Lifting;House hold activities;Other (comment)   reaching behind back, overhead; seat belt, bra don/doff   Diagnostic tests XR - negative    Patient Stated Goals to put on my bra, to be able to perform basic ADLs dressing/grooming with no help from husband    Currently in Pain? Yes    Pain Score 2     Pain Location Shoulder    Pain Orientation Left    Pain Descriptors / Indicators Discomfort    Pain Onset 1 to 4 weeks ago                             [facility] Adult PT Treatment/Exercise - 10/18/19 0001      Exercises   Exercises Shoulder      Shoulder Exercises: Supine   Flexion  AAROM;Left;10 reps    Flexion Limitations 10", reciprocal inhibition    Other Supine Exercises FR thoracic extension, pec stretch, scissors      Shoulder Exercises: ROM/Strengthening   UBE (Upper Arm Bike) Lvl 2 3 min fwd/2 min bkwd      Shoulder Exercises: Stretch   External Rotation Stretch 5 reps;20 seconds;Other (comment)   0 abduction   Other Shoulder Stretches Pec S at 60 3x20"                    PT Short Term Goals - 10/14/19 1350      PT SHORT TERM GOAL #1   Title Pt will be independent and compliant with initial HEP.    Time 1    Period Weeks    Status New    Target Date 10/21/19      PT SHORT TERM GOAL #2   Title Pt will increase shoulder flexion/abduction ROM at least 10 each.    Time 3    Period Weeks    Status New    Target Date  11/04/19      PT SHORT TERM GOAL #3   Title Pt will only wake up 1-2x/night secondary to L shoulder pain.    Baseline 3-4x    Time 3    Period Weeks    Status New    Target Date 11/04/19             PT Long Term Goals - 10/14/19 1351      PT LONG TERM GOAL #1   Title Pt will decrease FOTO disability to 37%, demonstrating a significant change in perceived functional ability.    Baseline 66% disability    Time 6    Period Weeks    Status New    Target Date 11/25/19      PT LONG TERM GOAL #2   Title Pt will increase true flexion/abduction ROM to >130 with <2/10 pain at most.    Time 6    Period Weeks    Status New    Target Date 11/25/19      PT LONG TERM GOAL #3   Title Pt will increase L shoulder MMT to 4+/5 without pain.    Time 6    Period Weeks    Status New    Target Date 11/25/19      PT LONG TERM GOAL #4   Title Pt will be able to perform all ADLs independently, including don/doff bra and zipping up shirts/dresses.    Baseline husband helps    Time 6    Period Weeks    Status New    Target Date 11/25/19      PT LONG TERM GOAL #5   Title Pt will sleep through the night without being  awoken with L shoulder pain.    Time 6    Period Weeks    Status New    Target Date 11/25/19                 Plan - 10/18/19 1107    Clinical Impression Statement Well tolerated tx with progression of thoracic mobility on FR and stretch of shoulder into flexion and pec opening on FR, focusing on lumbar stability to maximize thoracic/L shoulder opening. Pt was educated to use her husband's foam roller at home to progress mobility for improved positioning of shoulder and spine.    Personal Factors and Comorbidities Age;Past/Current Experience;Sex    Examination-Activity Limitations Bathing;Reach Overhead;Sleep;Carry;Lift    Examination-Participation Restrictions Driving;Interpersonal Relationship;Cleaning    Stability/Clinical Decision Making Stable/Uncomplicated    Rehab Potential Good    PT Frequency 2x / week    PT Duration 6 weeks    PT Treatment/Interventions ADLs/Self Care Home Management;Joint Manipulations;Spinal Manipulations;Vasopneumatic Device;Manual techniques;Passive range of motion;Patient/family education;Neuromuscular re-education;Therapeutic activities;Therapeutic exercise;Cryotherapy;Moist Heat    PT Next Visit Plan Assess HEP, progress pec flexibility, shoulder ROM, postural alignment; manual to address soft tissue/joint restrictions and increase ROM    PT Home Exercise Plan [id number]: supine AAROM shoulder flexion, standing ER at 0 S in doorway, pec S at 34 in doorway    Consulted and Agree with Plan of Care Patient           Patient will benefit from skilled therapeutic intervention in order to improve the following deficits and impairments:  Decreased mobility, Decreased strength, Impaired flexibility, Hypomobility, Impaired perceived functional ability, Pain, Impaired UE functional use, Decreased range of motion, Increased fascial restricitons  Visit Diagnosis: Strain of left shoulder, initial encounter  Stiffness of left shoulder, not elsewhere  classified  Acute  pain of left shoulder  Muscle weakness (generalized)     Problem List There are no problems to display for this patient.   Izell [place], DPT, PT 10/18/2019, 11:46 AM  [place] [address] [place], [place], [address] Phone: [phone]   Fax:  [phone]  Name: Felicia Barron MRN: [id number] Date of Birth: 1965/10/11

## 2019-10-20 ENCOUNTER — Ambulatory Visit: Payer: BC Managed Care – PPO | Admitting: Physical Therapy

## 2019-10-20 ENCOUNTER — Other Ambulatory Visit: Payer: Self-pay

## 2019-10-20 ENCOUNTER — Encounter: Payer: Self-pay | Admitting: Physical Therapy

## 2019-10-20 DIAGNOSIS — M25612 Stiffness of left shoulder, not elsewhere classified: Secondary | ICD-10-CM

## 2019-10-20 DIAGNOSIS — M6281 Muscle weakness (generalized): Secondary | ICD-10-CM

## 2019-10-20 DIAGNOSIS — M25512 Pain in left shoulder: Secondary | ICD-10-CM

## 2019-10-20 DIAGNOSIS — S46912A Strain of unspecified muscle, fascia and tendon at shoulder and upper arm level, left arm, initial encounter: Secondary | ICD-10-CM

## 2019-10-20 NOTE — Therapy (Signed)
[place], [place], [address] Phone: [phone]   Fax:  [phone]  Physical Therapy Treatment  Patient Details  Name: Sekai Nayak MRN: [id number] Date of Birth: 1965-04-12 Referring Provider (PT): Bernerd Limbo, MD   Encounter Date: 10/20/2019   PT End of Session - 10/20/19 1358    Visit Number 3    Number of Visits 12    Date for PT Re-Evaluation 11/25/19    PT Start Time 8850    PT Stop Time 1358    PT Time Calculation (min) 45 min    Activity Tolerance Patient tolerated treatment well    Behavior During Therapy [facility] for tasks assessed/performed           Past Medical History:  Diagnosis Date  . Anemia   . Migraine   . Skin cancer     Past Surgical History:  Procedure Laterality Date  . CESAREAN SECTION    . CHOLECYSTECTOMY    . WISDOM TOOTH EXTRACTION      There were no vitals filed for this visit.   Subjective Assessment - 10/20/19 1315    Subjective Pt states that exercises have been going well; says that she has been sleeping better. Pt reports no pain just aching/discomfort.    Currently in Pain? No/denies    Pain Location Shoulder    Pain Orientation Left                             [facility] Adult PT Treatment/Exercise - 10/20/19 0001      Shoulder Exercises: Standing   External Rotation Left;20 reps    Theraband Level (Shoulder External Rotation) Level 2 (Red)    Internal Rotation Left;20 reps    Theraband Level (Shoulder Internal Rotation) Level 2 (Red)    Extension Both;20 reps;Theraband    Theraband Level (Shoulder Extension) Level 2 (Red)    Row Both;20 reps;Theraband    Theraband Level (Shoulder Row) Level 2 (Red)      Shoulder Exercises: ROM/Strengthening   UBE (Upper Arm Bike) L3 3 min fwd/3 min bkwd    Ball on Wall with towel; flex/abd x10 each     Other ROM/Strengthening Exercises red TB on wall 3 ways x5 B    Other  ROM/Strengthening Exercises serratus push clockwise/counterclockwise x10 B      Manual Therapy   Manual Therapy Joint mobilization;Passive ROM    Joint Mobilization grade 2-3 for shoulder abd and gentle distraction for pain relief    Passive ROM L shoulder PROM all directions                    PT Short Term Goals - 10/14/19 1350      PT SHORT TERM GOAL #1   Title Pt will be independent and compliant with initial HEP.    Time 1    Period Weeks    Status New    Target Date 10/21/19      PT SHORT TERM GOAL #2   Title Pt will increase shoulder flexion/abduction ROM at least 10 each.    Time 3    Period Weeks    Status New    Target Date 11/04/19      PT SHORT TERM GOAL #3   Title Pt will only wake up 1-2x/night secondary to L shoulder pain.    Baseline 3-4x    Time 3  Period Weeks    Status New    Target Date 11/04/19             PT Long Term Goals - 10/14/19 1351      PT LONG TERM GOAL #1   Title Pt will decrease FOTO disability to 37%, demonstrating a significant change in perceived functional ability.    Baseline 66% disability    Time 6    Period Weeks    Status New    Target Date 11/25/19      PT LONG TERM GOAL #2   Title Pt will increase true flexion/abduction ROM to >130 with <2/10 pain at most.    Time 6    Period Weeks    Status New    Target Date 11/25/19      PT LONG TERM GOAL #3   Title Pt will increase L shoulder MMT to 4+/5 without pain.    Time 6    Period Weeks    Status New    Target Date 11/25/19      PT LONG TERM GOAL #4   Title Pt will be able to perform all ADLs independently, including don/doff bra and zipping up shirts/dresses.    Baseline husband helps    Time 6    Period Weeks    Status New    Target Date 11/25/19      PT LONG TERM GOAL #5   Title Pt will sleep through the night without being awoken with L shoulder pain.    Time 6    Period Weeks    Status New    Target Date 11/25/19                  Plan - 10/20/19 1358    Clinical Impression Statement Incorporated more scap stab and active shoulder ex's this rx; pt tolerated well. Modified to use red TB instead of green d/t pt reports of L shoulder pain. Pt demos most limited and painful in shoulder ER/IR PROM.    PT Treatment/Interventions ADLs/Self Care Home Management;Joint Manipulations;Spinal Manipulations;Vasopneumatic Device;Manual techniques;Passive range of motion;Patient/family education;Neuromuscular re-education;Therapeutic activities;Therapeutic exercise;Cryotherapy;Moist Heat    PT Next Visit Plan Assess HEP, progress pec flexibility, shoulder ROM, postural alignment; manual to address soft tissue/joint restrictions and increase ROM    Consulted and Agree with Plan of Care Patient           Patient will benefit from skilled therapeutic intervention in order to improve the following deficits and impairments:  Decreased mobility, Decreased strength, Impaired flexibility, Hypomobility, Impaired perceived functional ability, Pain, Impaired UE functional use, Decreased range of motion, Increased fascial restricitons  Visit Diagnosis: Strain of left shoulder, initial encounter  Stiffness of left shoulder, not elsewhere classified  Acute pain of left shoulder  Muscle weakness (generalized)     Problem List There are no problems to display for this patient.  Amador Cunas, PT, DPT Donald Prose Ramzey Petrovic 10/20/2019, 2:00 PM  [place] [address] [place], [place], [address] Phone: [phone]   Fax:  [phone]  Name: Jaidy Cottam MRN: [id number] Date of Birth: [birth year]

## 2019-10-26 ENCOUNTER — Other Ambulatory Visit: Payer: Self-pay

## 2019-10-26 ENCOUNTER — Encounter: Payer: Self-pay | Admitting: Physical Therapy

## 2019-10-26 ENCOUNTER — Ambulatory Visit: Payer: BC Managed Care – PPO | Admitting: Physical Therapy

## 2019-10-26 DIAGNOSIS — S46912A Strain of unspecified muscle, fascia and tendon at shoulder and upper arm level, left arm, initial encounter: Secondary | ICD-10-CM | POA: Diagnosis not present

## 2019-10-26 DIAGNOSIS — M6281 Muscle weakness (generalized): Secondary | ICD-10-CM

## 2019-10-26 DIAGNOSIS — M25612 Stiffness of left shoulder, not elsewhere classified: Secondary | ICD-10-CM

## 2019-10-26 DIAGNOSIS — M25512 Pain in left shoulder: Secondary | ICD-10-CM

## 2019-10-26 NOTE — Therapy (Signed)
[place] [address] [place], [place], [address] Phone: [phone]   Fax:  [phone]  Physical Therapy Treatment  Patient Details  Name: Felicia Barron MRN: [id number] Date of Birth: September 02, 1965 Referring Provider (PT): Bernerd Limbo, MD   Encounter Date: 10/26/2019   PT End of Session - 10/26/19 1101    Visit Number 4    Number of Visits 12    Date for PT Re-Evaluation 11/25/19    Authorization Type BCBS    PT Start Time 1016    PT Stop Time 1059    PT Time Calculation (min) 43 min    Activity Tolerance Patient tolerated treatment well    Behavior During Therapy [facility] for tasks assessed/performed           Past Medical History:  Diagnosis Date  . Anemia   . Migraine   . Skin cancer     Past Surgical History:  Procedure Laterality Date  . CESAREAN SECTION    . CHOLECYSTECTOMY    . WISDOM TOOTH EXTRACTION      There were no vitals filed for this visit.   Subjective Assessment - 10/26/19 1017    Subjective Pt states shoulder feels about the same; has been sleeping better    Currently in Pain? Yes    Pain Score 2     Pain Location Shoulder    Pain Orientation Left                             [facility] Adult PT Treatment/Exercise - 10/26/19 0001      Shoulder Exercises: Standing   External Rotation Left;20 reps    Theraband Level (Shoulder External Rotation) Level 2 (Red)    Internal Rotation Left;20 reps    Theraband Level (Shoulder Internal Rotation) Level 2 (Red)    Extension Both;20 reps;Theraband    Theraband Level (Shoulder Extension) Level 2 (Red)      Shoulder Exercises: ROM/Strengthening   UBE (Upper Arm Bike) L3 3 min fwd/3 min bkwd    Lat Pull Limitations 15# 2x10    Cybex Press Limitations 5# 2x10    Cybex Row Limitations 15# 2x10    Ball on Wall with towel; flex/abd x10 each     Other ROM/Strengthening Exercises red TB on wall 3 ways x5 B      Manual  Therapy   Manual Therapy Joint mobilization;Passive ROM    Joint Mobilization grade 2-3 for shoulder abd and gentle distraction for pain relief    Passive ROM L shoulder PROM all directions                    PT Short Term Goals - 10/14/19 1350      PT SHORT TERM GOAL #1   Title Pt will be independent and compliant with initial HEP.    Time 1    Period Weeks    Status New    Target Date 10/21/19      PT SHORT TERM GOAL #2   Title Pt will increase shoulder flexion/abduction ROM at least 10 each.    Time 3    Period Weeks    Status New    Target Date 11/04/19      PT SHORT TERM GOAL #3   Title Pt will only wake up 1-2x/night secondary to L shoulder pain.    Baseline 3-4x    Time 3  Period Weeks    Status New    Target Date 11/04/19             PT Long Term Goals - 10/14/19 1351      PT LONG TERM GOAL #1   Title Pt will decrease FOTO disability to 37%, demonstrating a significant change in perceived functional ability.    Baseline 66% disability    Time 6    Period Weeks    Status New    Target Date 11/25/19      PT LONG TERM GOAL #2   Title Pt will increase true flexion/abduction ROM to >130 with <2/10 pain at most.    Time 6    Period Weeks    Status New    Target Date 11/25/19      PT LONG TERM GOAL #3   Title Pt will increase L shoulder MMT to 4+/5 without pain.    Time 6    Period Weeks    Status New    Target Date 11/25/19      PT LONG TERM GOAL #4   Title Pt will be able to perform all ADLs independently, including don/doff bra and zipping up shirts/dresses.    Baseline husband helps    Time 6    Period Weeks    Status New    Target Date 11/25/19      PT LONG TERM GOAL #5   Title Pt will sleep through the night without being awoken with L shoulder pain.    Time 6    Period Weeks    Status New    Target Date 11/25/19                 Plan - 10/26/19 1102    Clinical Impression Statement Pt tolerated progression of TE  well. Required cues for posture/form during standing ex's. Gentle progression to machine interventions. Pt demos most limited and painful in shoulder IR PROM this rx.    PT Treatment/Interventions ADLs/Self Care Home Management;Joint Manipulations;Spinal Manipulations;Vasopneumatic Device;Manual techniques;Passive range of motion;Patient/family education;Neuromuscular re-education;Therapeutic activities;Therapeutic exercise;Cryotherapy;Moist Heat    PT Next Visit Plan Assess HEP, progress pec flexibility, shoulder ROM, postural alignment; manual to address soft tissue/joint restrictions and increase ROM    Consulted and Agree with Plan of Care Patient           Patient will benefit from skilled therapeutic intervention in order to improve the following deficits and impairments:  Decreased mobility, Decreased strength, Impaired flexibility, Hypomobility, Impaired perceived functional ability, Pain, Impaired UE functional use, Decreased range of motion, Increased fascial restricitons  Visit Diagnosis: Strain of left shoulder, initial encounter  Stiffness of left shoulder, not elsewhere classified  Acute pain of left shoulder  Muscle weakness (generalized)     Problem List There are no problems to display for this patient.  Amador Cunas, PT, DPT Donald Prose Neri Samek 10/26/2019, 11:03 AM  [place] [address] [place], [place], [address] Phone: [phone]   Fax:  [phone]  Name: Felicia Barron MRN: [id number] Date of Birth: [birth year]

## 2019-10-28 ENCOUNTER — Other Ambulatory Visit: Payer: Self-pay

## 2019-10-28 ENCOUNTER — Ambulatory Visit: Payer: BC Managed Care – PPO | Admitting: Physical Therapy

## 2019-10-28 ENCOUNTER — Encounter: Payer: Self-pay | Admitting: Physical Therapy

## 2019-10-28 DIAGNOSIS — M6281 Muscle weakness (generalized): Secondary | ICD-10-CM

## 2019-10-28 DIAGNOSIS — S46912A Strain of unspecified muscle, fascia and tendon at shoulder and upper arm level, left arm, initial encounter: Secondary | ICD-10-CM

## 2019-10-28 DIAGNOSIS — M25612 Stiffness of left shoulder, not elsewhere classified: Secondary | ICD-10-CM

## 2019-10-28 DIAGNOSIS — M25512 Pain in left shoulder: Secondary | ICD-10-CM

## 2019-10-28 NOTE — Therapy (Signed)
[place], [place], [address] Phone: [phone]   Fax:  [phone]  Physical Therapy Treatment  Patient Details  Name: Felicia Barron MRN: [id number] Date of Birth: 01-Mar-1966 Referring Provider (PT): Bernerd Limbo, MD   Encounter Date: 10/28/2019   PT End of Session - 10/28/19 1143    Visit Number 5    Number of Visits 12    Date for PT Re-Evaluation 11/25/19    PT Start Time 1058    PT Stop Time 1142    PT Time Calculation (min) 44 min    Activity Tolerance Patient tolerated treatment well    Behavior During Therapy [facility] for tasks assessed/performed           Past Medical History:  Diagnosis Date  . Anemia   . Migraine   . Skin cancer     Past Surgical History:  Procedure Laterality Date  . CESAREAN SECTION    . CHOLECYSTECTOMY    . WISDOM TOOTH EXTRACTION      There were no vitals filed for this visit.   Subjective Assessment - 10/28/19 1059    Subjective Pt states feeling good today    Currently in Pain? No/denies              [facility] [place] PT Assessment - 10/28/19 0001      AROM   AROM Assessment Site Shoulder    Right/Left Shoulder Left    Right Shoulder Flexion 150 Degrees    Right Shoulder ABduction 170 Degrees    Right Shoulder Internal Rotation --   T5   Right Shoulder External Rotation --   T3   Left Shoulder Flexion 130 Degrees    Left Shoulder ABduction 135 Degrees    Left Shoulder Internal Rotation --   L1   Left Shoulder External Rotation --   T2                        [facility] Adult PT Treatment/Exercise - 10/28/19 0001      Shoulder Exercises: ROM/Strengthening   UBE (Upper Arm Bike) L3 3 min fwd/3 min bkwd    Lat Pull Limitations 15# 2x10    Cybex Press Limitations 5# 2x10    Cybex Row Limitations 15# 2x10    Ball on Wall with towel; flex/abd x10 each     Other ROM/Strengthening Exercises red TB on wall 3 ways x5 B    Other  ROM/Strengthening Exercises tricep pulldowns 20# 2x10; bicep curls 10# 2x10; standing shoulder ext 5# 2x10      Manual Therapy   Manual Therapy Joint mobilization;Passive ROM    Joint Mobilization grade 2-3 for shoulder abd and gentle distraction for pain relief    Passive ROM L shoulder PROM all directions                    PT Short Term Goals - 10/28/19 1129      PT SHORT TERM GOAL #1   Title Pt will be independent and compliant with initial HEP.    Time 1    Period Weeks    Status Achieved    Target Date 10/21/19      PT SHORT TERM GOAL #2   Title Pt will increase shoulder flexion/abduction ROM at least 10 each.    Time 3    Period Weeks    Status Partially Met    Target Date 11/04/19  PT SHORT TERM GOAL #3   Title Pt will only wake up 1-2x/night secondary to L shoulder pain.    Baseline 3-4x    Time 3    Period Weeks    Status On-going    Target Date 11/04/19             PT Long Term Goals - 10/28/19 1142      PT LONG TERM GOAL #1   Title Pt will decrease FOTO disability to 37%, demonstrating a significant change in perceived functional ability.    Baseline 66% disability    Time 6    Period Weeks    Status New      PT LONG TERM GOAL #2   Title Pt will increase true flexion/abduction ROM to >130 with <2/10 pain at most.    Time 6    Period Weeks    Status Partially Met      PT LONG TERM GOAL #3   Title Pt will increase L shoulder MMT to 4+/5 without pain.    Time 6    Period Weeks    Status New      PT LONG TERM GOAL #4   Title Pt will be able to perform all ADLs independently, including don/doff bra and zipping up shirts/dresses.    Baseline husband helps    Time 6    Period Weeks    Status On-going      PT LONG TERM GOAL #5   Title Pt will sleep through the night without being awoken with L shoulder pain.    Time 6    Period Weeks    Status On-going                 Plan - 10/28/19 1143    Clinical Impression  Statement Pt demos weakness of LUE with fatigue and shaking with increasing reps of UE machine interventions. Pt reports no increase in LUE pain with exercise. Pt demos most limited and painful in shoulder IR PROM this rx.    PT Treatment/Interventions ADLs/Self Care Home Management;Joint Manipulations;Spinal Manipulations;Vasopneumatic Device;Manual techniques;Passive range of motion;Patient/family education;Neuromuscular re-education;Therapeutic activities;Therapeutic exercise;Cryotherapy;Moist Heat    PT Next Visit Plan progress pec flexibility, shoulder ROM, postural alignment; manual to address soft tissue/joint restrictions and increase ROM    Consulted and Agree with Plan of Care Patient           Patient will benefit from skilled therapeutic intervention in order to improve the following deficits and impairments:  Decreased mobility, Decreased strength, Impaired flexibility, Hypomobility, Impaired perceived functional ability, Pain, Impaired UE functional use, Decreased range of motion, Increased fascial restricitons  Visit Diagnosis: Strain of left shoulder, initial encounter  Stiffness of left shoulder, not elsewhere classified  Acute pain of left shoulder  Muscle weakness (generalized)     Problem List There are no problems to display for this patient.  Amador Cunas, PT, DPT Donald Prose Eboni Coval 10/28/2019, 11:45 AM  [place] [address] [place], [place], [address] Phone: [phone]   Fax:  [phone]  Name: Felicia Barron MRN: [id number] Date of Birth: [birth year]

## 2019-11-02 ENCOUNTER — Other Ambulatory Visit: Payer: Self-pay

## 2019-11-02 ENCOUNTER — Encounter: Payer: Self-pay | Admitting: Physical Therapy

## 2019-11-02 ENCOUNTER — Ambulatory Visit: Payer: BC Managed Care – PPO | Admitting: Physical Therapy

## 2019-11-02 DIAGNOSIS — S46912A Strain of unspecified muscle, fascia and tendon at shoulder and upper arm level, left arm, initial encounter: Secondary | ICD-10-CM

## 2019-11-02 DIAGNOSIS — M25612 Stiffness of left shoulder, not elsewhere classified: Secondary | ICD-10-CM

## 2019-11-02 DIAGNOSIS — M6281 Muscle weakness (generalized): Secondary | ICD-10-CM

## 2019-11-02 DIAGNOSIS — M25512 Pain in left shoulder: Secondary | ICD-10-CM

## 2019-11-02 NOTE — Therapy (Signed)
[place], [place], [address] Phone: [phone]   Fax:  [phone]  Physical Therapy Treatment  Patient Details  Name: Felicia Barron MRN: [id number] Date of Birth: 05/29/1965 Referring Provider (PT): Bernerd Limbo, MD   Encounter Date: 11/02/2019   PT End of Session - 11/02/19 1101    Visit Number 6    Number of Visits 12    Date for PT Re-Evaluation 11/25/19    Authorization Type BCBS    PT Start Time 1015    PT Stop Time 1101    PT Time Calculation (min) 46 min    Activity Tolerance Patient tolerated treatment well;Patient limited by pain    Behavior During Therapy [facility] for tasks assessed/performed           Past Medical History:  Diagnosis Date  . Anemia   . Migraine   . Skin cancer     Past Surgical History:  Procedure Laterality Date  . CESAREAN SECTION    . CHOLECYSTECTOMY    . WISDOM TOOTH EXTRACTION      There were no vitals filed for this visit.   Subjective Assessment - 11/02/19 1021    Subjective Pt states shoulder is getting more painful with activity; notes she is having increasing difficulty putting hair in ponytail.    Currently in Pain? Yes    Pain Score 5     Pain Location Shoulder    Pain Orientation Left                             [facility] Adult PT Treatment/Exercise - 11/02/19 0001      Shoulder Exercises: Standing   Other Standing Exercises wall ladder x10 flexion/abd   arm shaking at end range     Shoulder Exercises: ROM/Strengthening   UBE (Upper Arm Bike) L3 3 min fwd/3 min bkwd    Nustep L5 x 6 min    Lat Pull Limitations 15# 2x10    Cybex Press Limitations 5# 2x10    Cybex Row Limitations 15# 2x10    Other ROM/Strengthening Exercises shoulder ext 5# 2x10      Manual Therapy   Manual Therapy Joint mobilization;Passive ROM    Joint Mobilization grade 2-3 for shoulder abd and gentle distraction for pain relief    Passive  ROM L shoulder PROM all directions                    PT Short Term Goals - 10/28/19 1129      PT SHORT TERM GOAL #1   Title Pt will be independent and compliant with initial HEP.    Time 1    Period Weeks    Status Achieved    Target Date 10/21/19      PT SHORT TERM GOAL #2   Title Pt will increase shoulder flexion/abduction ROM at least 10 each.    Time 3    Period Weeks    Status Partially Met    Target Date 11/04/19      PT SHORT TERM GOAL #3   Title Pt will only wake up 1-2x/night secondary to L shoulder pain.    Baseline 3-4x    Time 3    Period Weeks    Status On-going    Target Date 11/04/19             PT Long Term Goals - 10/28/19  [place] #1   Title Pt will decrease FOTO disability to 37%, demonstrating a significant change in perceived functional ability.    Baseline 66% disability    Time 6    Period Weeks    Status New      PT LONG TERM GOAL #2   Title Pt will increase true flexion/abduction ROM to >130 with <2/10 pain at most.    Time 6    Period Weeks    Status Partially Met      PT LONG TERM GOAL #3   Title Pt will increase L shoulder MMT to 4+/5 without pain.    Time 6    Period Weeks    Status New      PT LONG TERM GOAL #4   Title Pt will be able to perform all ADLs independently, including don/doff bra and zipping up shirts/dresses.    Baseline husband helps    Time 6    Period Weeks    Status On-going      PT LONG TERM GOAL #5   Title Pt will sleep through the night without being awoken with L shoulder pain.    Time 6    Period Weeks    Status On-going                 Plan - 11/02/19 1102    Clinical Impression Statement Pt demos increased pain and decreased shoulder ROM compared to last rx. Pt has increased stiffness and ROM with passive ROM and manual tx this rx. Plan to update HEP next rx, incorporate more manual tx, and return to MD if symptoms continue to worsen.    PT  Treatment/Interventions ADLs/Self Care Home Management;Joint Manipulations;Spinal Manipulations;Vasopneumatic Device;Manual techniques;Passive range of motion;Patient/family education;Neuromuscular re-education;Therapeutic activities;Therapeutic exercise;Cryotherapy;Moist Heat    PT Next Visit Plan progress pec flexibility, shoulder ROM, postural alignment; manual to address soft tissue/joint restrictions and increase ROM    Consulted and Agree with Plan of Care Patient           Patient will benefit from skilled therapeutic intervention in order to improve the following deficits and impairments:  Decreased mobility, Decreased strength, Impaired flexibility, Hypomobility, Impaired perceived functional ability, Pain, Impaired UE functional use, Decreased range of motion, Increased fascial restricitons  Visit Diagnosis: Strain of left shoulder, initial encounter  Stiffness of left shoulder, not elsewhere classified  Acute pain of left shoulder  Muscle weakness (generalized)     Problem List There are no problems to display for this patient.  Amador Cunas, PT, DPT Donald Prose Tomie Spizzirri 11/02/2019, 11:21 AM  [address] [place], [place], [address] Phone: [phone]   Fax:  [phone]  Name: Emila Steinhauser MRN: [id number] Date of Birth: 23-Apr-1965

## 2019-11-04 ENCOUNTER — Encounter: Payer: Self-pay | Admitting: Physical Therapy

## 2019-11-04 ENCOUNTER — Ambulatory Visit: Payer: BC Managed Care – PPO | Admitting: Physical Therapy

## 2019-11-04 ENCOUNTER — Other Ambulatory Visit: Payer: Self-pay

## 2019-11-04 DIAGNOSIS — M25512 Pain in left shoulder: Secondary | ICD-10-CM

## 2019-11-04 DIAGNOSIS — M6281 Muscle weakness (generalized): Secondary | ICD-10-CM

## 2019-11-04 DIAGNOSIS — S46912A Strain of unspecified muscle, fascia and tendon at shoulder and upper arm level, left arm, initial encounter: Secondary | ICD-10-CM | POA: Diagnosis not present

## 2019-11-04 DIAGNOSIS — M25612 Stiffness of left shoulder, not elsewhere classified: Secondary | ICD-10-CM

## 2019-11-04 NOTE — Therapy (Signed)
[place], [place], [address] Phone: [phone]   Fax:  [phone]  Physical Therapy Treatment  Patient Details  Name: Felicia Barron MRN: [id number] Date of Birth: 12-18-1965 Referring Provider (PT): Bernerd Limbo, MD   Encounter Date: 11/04/2019   PT End of Session - 11/04/19 1017    Visit Number 7    Number of Visits 12    Date for PT Re-Evaluation 11/25/19    Authorization Type BCBS    PT Start Time 0930    PT Stop Time 1022    PT Time Calculation (min) 52 min    Activity Tolerance Patient tolerated treatment well;Patient limited by pain    Behavior During Therapy [facility] for tasks assessed/performed           Past Medical History:  Diagnosis Date   Anemia    Migraine    Skin cancer     Past Surgical History:  Procedure Laterality Date   CESAREAN SECTION     CHOLECYSTECTOMY     WISDOM TOOTH EXTRACTION      There were no vitals filed for this visit.   Subjective Assessment - 11/04/19 0933    Subjective Pt states shoulder is getting more painful; having more trouble with dressing and doing hair    Currently in Pain? Yes    Pain Score 5     Pain Location Shoulder    Pain Orientation Left              [facility] PT Assessment - 11/04/19 0001      AROM   Right Shoulder Flexion 160 Degrees    Right Shoulder ABduction 170 Degrees    Left Shoulder Flexion 120 Degrees    Left Shoulder ABduction 95 Degrees    Left Shoulder Internal Rotation --   unable to perform   Left Shoulder External Rotation --   T2     PROM   Left Shoulder Flexion 130 Degrees    Left Shoulder ABduction 90 Degrees    Left Shoulder Internal Rotation 55 Degrees    Left Shoulder External Rotation 50 Degrees                         [facility] Adult PT Treatment/Exercise - 11/04/19 0001      Shoulder Exercises: Standing   Other Standing Exercises wall ladder x5 flexion/abd    Other  Standing Exercises flexion/abduction stretch on wall x5 LUE      Shoulder Exercises: ROM/Strengthening   UBE (Upper Arm Bike) L3 3 min fwd/3 min bkwd    Nustep L5 x 6 min      Modalities   Modalities Moist Heat;Electrical Stimulation      Moist Heat Therapy   Number Minutes Moist Heat 15 Minutes    Moist Heat Location Shoulder      Electrical Stimulation   Electrical Stimulation Location Shoulder    Electrical Stimulation Action IFC    Electrical Stimulation Parameters supine    Electrical Stimulation Goals Pain      Manual Therapy   Manual Therapy Passive ROM    Passive ROM L shoulder PROM all directions                    PT Short Term Goals - 10/28/19 1129      PT SHORT TERM GOAL #1   Title Pt will be independent and compliant with initial HEP.  Time 1    Period Weeks    Status Achieved    Target Date 10/21/19      PT SHORT TERM GOAL #2   Title Pt will increase shoulder flexion/abduction ROM at least 10 each.    Time 3    Period Weeks    Status Partially Met    Target Date 11/04/19      PT SHORT TERM GOAL #3   Title Pt will only wake up 1-2x/night secondary to L shoulder pain.    Baseline 3-4x    Time 3    Period Weeks    Status On-going    Target Date 11/04/19             PT Long Term Goals - 10/28/19 1142      PT LONG TERM GOAL #1   Title Pt will decrease FOTO disability to 37%, demonstrating a significant change in perceived functional ability.    Baseline 66% disability    Time 6    Period Weeks    Status New      PT LONG TERM GOAL #2   Title Pt will increase true flexion/abduction ROM to >130 with <2/10 pain at most.    Time 6    Period Weeks    Status Partially Met      PT LONG TERM GOAL #3   Title Pt will increase L shoulder MMT to 4+/5 without pain.    Time 6    Period Weeks    Status New      PT LONG TERM GOAL #4   Title Pt will be able to perform all ADLs independently, including don/doff bra and zipping up  shirts/dresses.    Baseline husband helps    Time 6    Period Weeks    Status On-going      PT LONG TERM GOAL #5   Title Pt will sleep through the night without being awoken with L shoulder pain.    Time 6    Period Weeks    Status On-going                 Plan - 11/04/19 1017    Clinical Impression Statement Pt demos increased pain and decreased shoulder ROM compared to time of eval. Pt is having increasing difficulty with ADLs and has problems tolerating PROM this rx d/t shoulder pain. Discussed putting pt on hold and pt will call MD to return d/t regressing/lack of progress with pt verbalized understanding/agreement. Pt will call back to resume tx depending on POC from MD.    PT Treatment/Interventions ADLs/Self Care Home Management;Joint Manipulations;Spinal Manipulations;Vasopneumatic Device;Manual techniques;Passive range of motion;Patient/family education;Neuromuscular re-education;Therapeutic activities;Therapeutic exercise;Cryotherapy;Moist Heat    PT Next Visit Plan on hold for PT until return to MD    Consulted and Agree with Plan of Care Patient           Patient will benefit from skilled therapeutic intervention in order to improve the following deficits and impairments:  Decreased mobility, Decreased strength, Impaired flexibility, Hypomobility, Impaired perceived functional ability, Pain, Impaired UE functional use, Decreased range of motion, Increased fascial restricitons  Visit Diagnosis: Strain of left shoulder, initial encounter  Stiffness of left shoulder, not elsewhere classified  Acute pain of left shoulder  Muscle weakness (generalized)     Problem List There are no problems to display for this patient.  Felicia Barron, PT, DPT Donald Prose Felicia Barron 11/04/2019, 10:19 AM  [place]  [place], [place], [address] Phone: [phone]   Fax:  [phone]  Name: Felicia Barron MRN: [id number] Date of Birth: 1965-06-12

## 2019-11-09 ENCOUNTER — Ambulatory Visit: Payer: BC Managed Care – PPO | Admitting: Physical Therapy

## 2019-11-11 ENCOUNTER — Ambulatory Visit: Payer: BC Managed Care – PPO | Admitting: Physical Therapy

## 2019-11-19 ENCOUNTER — Other Ambulatory Visit: Payer: Self-pay

## 2019-11-19 ENCOUNTER — Ambulatory Visit: Payer: BC Managed Care – PPO | Attending: Family Medicine | Admitting: Physical Therapy

## 2019-11-19 ENCOUNTER — Encounter: Payer: Self-pay | Admitting: Physical Therapy

## 2019-11-19 DIAGNOSIS — M6281 Muscle weakness (generalized): Secondary | ICD-10-CM | POA: Diagnosis present

## 2019-11-19 DIAGNOSIS — M25612 Stiffness of left shoulder, not elsewhere classified: Secondary | ICD-10-CM | POA: Diagnosis not present

## 2019-11-19 DIAGNOSIS — S46912A Strain of unspecified muscle, fascia and tendon at shoulder and upper arm level, left arm, initial encounter: Secondary | ICD-10-CM | POA: Diagnosis present

## 2019-11-19 DIAGNOSIS — M25512 Pain in left shoulder: Secondary | ICD-10-CM | POA: Diagnosis present

## 2019-11-19 NOTE — Therapy (Signed)
[place], [place], [address] Phone: [phone]   Fax:  [phone]  Physical Therapy Treatment  Patient Details  Name: Felicia Barron MRN: [id number] Date of Birth: 01-15-1966 Referring Provider (PT): Bernerd Limbo, MD   Encounter Date: 11/19/2019   PT End of Session - 11/19/19 1136    Visit Number 8    Number of Visits 12    Date for PT Re-Evaluation 11/25/19    Authorization Type [employer]    PT Start Time 0930    PT Stop Time 1012    PT Time Calculation (min) 42 min    Activity Tolerance Patient tolerated treatment well;Patient limited by pain    Behavior During Therapy [facility] for tasks assessed/performed           Past Medical History:  Diagnosis Date   Anemia    Migraine    Skin cancer     Past Surgical History:  Procedure Laterality Date   CESAREAN SECTION     CHOLECYSTECTOMY     WISDOM TOOTH EXTRACTION      There were no vitals filed for this visit.   Subjective Assessment - 11/19/19 0938    Subjective Pt states shoulder is more painful; was told by MD it is likely a shoulder strain and not frozen shoulder. Concerned about whether or not she should continue PT    Currently in Pain? Yes    Pain Score 7     Pain Location Shoulder    Pain Orientation Left              [facility] PT Assessment - 11/19/19 0001      AROM   Overall AROM Comments Cervical ROM WFL with no change in UE pain with cervical movement    Left Shoulder Flexion 95 Degrees    Left Shoulder ABduction 85 Degrees      PROM   Left Shoulder Flexion 130 Degrees    Left Shoulder ABduction 110 Degrees      Palpation   Palpation comment no significant tenderness to palpation over biceps tendon, no tenderness in cervical spine      Special Tests    Special Tests Biceps/Labral Tests;Cervical    Cervical Tests Spurling's;Dictraction    Biceps/Labral tests Clunk Test      Spurling's   Findings  Negative    Comment neg B      Distraction Test   Findngs Negative    Comment no change in reported pain in arm/shoulder with cervical distraction      Clunk Test   Findings Negative    Side Left      Speeds test   findings Negative    Side Left      Yergason's Test   Findings Negative    Side Left                         [facility] Adult PT Treatment/Exercise - 11/19/19 0001      Moist Heat Therapy   Number Minutes Moist Heat 15 Minutes    Moist Heat Location Shoulder      Electrical Stimulation   Electrical Stimulation Location Shoulder    Electrical Stimulation Action IFC    Electrical Stimulation Parameters supine    Electrical Stimulation Goals Pain                    PT Short Term Goals - 10/28/19 1129  PT SHORT TERM GOAL #1   Title Pt will be independent and compliant with initial HEP.    Time 1    Period Weeks    Status Achieved    Target Date 10/21/19      PT SHORT TERM GOAL #2   Title Pt will increase shoulder flexion/abduction ROM at least 10 each.    Time 3    Period Weeks    Status Partially Met    Target Date 11/04/19      PT SHORT TERM GOAL #3   Title Pt will only wake up 1-2x/night secondary to L shoulder pain.    Baseline 3-4x    Time 3    Period Weeks    Status On-going    Target Date 11/04/19             PT Long Term Goals - 10/28/19 1142      PT LONG TERM GOAL #1   Title Pt will decrease FOTO disability to 37%, demonstrating a significant change in perceived functional ability.    Baseline 66% disability    Time 6    Period Weeks    Status New      PT LONG TERM GOAL #2   Title Pt will increase true flexion/abduction ROM to >130 with <2/10 pain at most.    Time 6    Period Weeks    Status Partially Met      PT LONG TERM GOAL #3   Title Pt will increase L shoulder MMT to 4+/5 without pain.    Time 6    Period Weeks    Status New      PT LONG TERM GOAL #4   Title Pt will be able to perform  all ADLs independently, including don/doff bra and zipping up shirts/dresses.    Baseline husband helps    Time 6    Period Weeks    Status On-going      PT LONG TERM GOAL #5   Title Pt will sleep through the night without being awoken with L shoulder pain.    Time 6    Period Weeks    Status On-going                 Plan - 11/19/19 1138    Clinical Impression Statement With pt, made decision to put pt on hold for 2 weeks. Special tests for cervical, shoulder, and biceps tendon were negative with inconclusive findings. Pt has pain with end ranges of passive and active motions. ROM is most limited in shoulder abduction, external rotation, and medial rotation. Pt still having difficulty getting dressed and washing hair. Pt reports she was told by MD to rest and given anti inflammatories along with a cortisone injection. Instructed pt on use of home TENs unit and educated on gentle, pain free stretching with VU.    PT Treatment/Interventions ADLs/Self Care Home Management;Joint Manipulations;Spinal Manipulations;Vasopneumatic Device;Manual techniques;Passive range of motion;Patient/family education;Neuromuscular re-education;Therapeutic activities;Therapeutic exercise;Cryotherapy;Moist Heat    PT Next Visit Plan reassess sx and develop plan    Consulted and Agree with Plan of Care Patient           Patient will benefit from skilled therapeutic intervention in order to improve the following deficits and impairments:  Decreased mobility, Decreased strength, Impaired flexibility, Hypomobility, Impaired perceived functional ability, Pain, Impaired UE functional use, Decreased range of motion, Increased fascial restricitons  Visit Diagnosis: Stiffness of left shoulder, not elsewhere classified  Acute pain of left  shoulder  Muscle weakness (generalized)     Problem List There are no problems to display for this patient.  Amador Cunas, PT, DPT Donald Prose Mikaela Hilgeman 11/19/2019, 11:45  AM  [place] [address], [place], [address] Phone: [phone]   Fax:  [phone]  Name: Aldene Hendon MRN: [id number] Date of Birth: [birth year]

## 2019-12-03 ENCOUNTER — Other Ambulatory Visit: Payer: Self-pay

## 2019-12-03 ENCOUNTER — Ambulatory Visit: Payer: BC Managed Care – PPO | Admitting: Physical Therapy

## 2019-12-03 ENCOUNTER — Encounter: Payer: Self-pay | Admitting: Physical Therapy

## 2019-12-03 DIAGNOSIS — M25512 Pain in left shoulder: Secondary | ICD-10-CM

## 2019-12-03 DIAGNOSIS — M25612 Stiffness of left shoulder, not elsewhere classified: Secondary | ICD-10-CM

## 2019-12-03 DIAGNOSIS — S46912A Strain of unspecified muscle, fascia and tendon at shoulder and upper arm level, left arm, initial encounter: Secondary | ICD-10-CM

## 2019-12-03 DIAGNOSIS — M6281 Muscle weakness (generalized): Secondary | ICD-10-CM

## 2019-12-03 NOTE — Therapy (Signed)
[place] [place], [place], [address] Phone: [phone]   Fax:  [phone]  Physical Therapy Treatment  Patient Details  Name: Felicia Barron MRN: [id number] Date of Birth: Dec 02, 1965 Referring Provider (PT): Bernerd Limbo, MD   Encounter Date: 12/03/2019   PT End of Session - 12/03/19 1125    Visit Number 9    Number of Visits 12    Date for PT Re-Evaluation 01/02/20    PT Start Time 9476    PT Stop Time 1100    PT Time Calculation (min) 45 min    Activity Tolerance Patient tolerated treatment well;Patient limited by pain    Behavior During Therapy [facility] for tasks assessed/performed           Past Medical History:  Diagnosis Date  . Anemia   . Migraine   . Skin cancer     Past Surgical History:  Procedure Laterality Date  . CESAREAN SECTION    . CHOLECYSTECTOMY    . WISDOM TOOTH EXTRACTION      There were no vitals filed for this visit.   Subjective Assessment - 12/03/19 1018    Subjective Pt states that shoulder is still very painful; states shoulder has been locking up some. States having trouble driving now and completing ADLs moreso than before; notes more limited shoulder ROM.    Currently in Pain? Yes    Pain Score 7     Pain Location Shoulder    Pain Orientation Left    Pain Descriptors / Indicators Aching;Throbbing;Dull    Pain Type Chronic pain              [facility] PT Assessment - 12/03/19 0001      AROM   Left Shoulder Flexion 75 Degrees    Left Shoulder ABduction 70 Degrees      PROM   Left Shoulder Flexion 110 Degrees    Left Shoulder ABduction 95 Degrees                         [facility] Adult PT Treatment/Exercise - 12/03/19 0001      Shoulder Exercises: ROM/Strengthening   UBE (Upper Arm Bike) L3 3 min fwd/3 min bkwd    Nustep L5 x 6 min      Manual Therapy   Manual Therapy Passive ROM    Passive ROM L shoulder PROM all directions                     PT Short Term Goals - 10/28/19 1129      PT SHORT TERM GOAL #1   Title Pt will be independent and compliant with initial HEP.    Time 1    Period Weeks    Status Achieved    Target Date 10/21/19      PT SHORT TERM GOAL #2   Title Pt will increase shoulder flexion/abduction ROM at least 10 each.    Time 3    Period Weeks    Status Partially Met    Target Date 11/04/19      PT SHORT TERM GOAL #3   Title Pt will only wake up 1-2x/night secondary to L shoulder pain.    Baseline 3-4x    Time 3    Period Weeks    Status On-going    Target Date 11/04/19             PT Long Term Goals -  10/28/19 1142      PT LONG TERM GOAL #1   Title Pt will decrease FOTO disability to 37%, demonstrating a significant change in perceived functional ability.    Baseline 66% disability    Time 6    Period Weeks    Status New      PT LONG TERM GOAL #2   Title Pt will increase true flexion/abduction ROM to >130 with <2/10 pain at most.    Time 6    Period Weeks    Status Partially Met      PT LONG TERM GOAL #3   Title Pt will increase L shoulder MMT to 4+/5 without pain.    Time 6    Period Weeks    Status New      PT LONG TERM GOAL #4   Title Pt will be able to perform all ADLs independently, including don/doff bra and zipping up shirts/dresses.    Baseline husband helps    Time 6    Period Weeks    Status On-going      PT LONG TERM GOAL #5   Title Pt will sleep through the night without being awoken with L shoulder pain.    Time 6    Period Weeks    Status On-going                 Plan - 12/03/19 1125    Clinical Impression Statement After two weeks on hold from PT, pt demos increased pain and decreased ROM globally. Pt is having trouble driving and completing ADLs d/t pain. Pt experiencing most pain with shoulder ER and abduction stretching with most significant losses of ROM in those directions. Focused today's tx on gentle ex's and PROM to  increase motion/decrease pain. Updated pt HEP to include shoulder isometrics with pt VU.    PT Treatment/Interventions ADLs/Self Care Home Management;Joint Manipulations;Spinal Manipulations;Vasopneumatic Device;Manual techniques;Passive range of motion;Patient/family education;Neuromuscular re-education;Therapeutic activities;Therapeutic exercise;Cryotherapy;Moist Heat    PT Next Visit Plan gentle stretching and PROM, ex to tolerance    Consulted and Agree with Plan of Care Patient           Patient will benefit from skilled therapeutic intervention in order to improve the following deficits and impairments:  Decreased mobility, Decreased strength, Impaired flexibility, Hypomobility, Impaired perceived functional ability, Pain, Impaired UE functional use, Decreased range of motion, Increased fascial restricitons  Visit Diagnosis: Stiffness of left shoulder, not elsewhere classified  Acute pain of left shoulder  Muscle weakness (generalized)  Strain of left shoulder, initial encounter     Problem List There are no problems to display for this patient.  Amador Cunas, PT, DPT Donald Prose Aldrick Derrig 12/03/2019, 11:28 AM  [place]. [place], [place], [address] Phone: [phone]   Fax:  [phone]  Name: Felicia Barron MRN: [id number] Date of Birth: October 26, 1965

## 2019-12-08 ENCOUNTER — Encounter: Payer: Self-pay | Admitting: Physical Therapy

## 2019-12-13 ENCOUNTER — Encounter: Payer: Self-pay | Admitting: Physical Therapy

## 2019-12-13 ENCOUNTER — Ambulatory Visit: Payer: BC Managed Care – PPO | Attending: Family Medicine | Admitting: Physical Therapy

## 2019-12-13 ENCOUNTER — Other Ambulatory Visit: Payer: Self-pay

## 2019-12-13 DIAGNOSIS — M25512 Pain in left shoulder: Secondary | ICD-10-CM | POA: Insufficient documentation

## 2019-12-13 DIAGNOSIS — M25612 Stiffness of left shoulder, not elsewhere classified: Secondary | ICD-10-CM | POA: Insufficient documentation

## 2019-12-13 DIAGNOSIS — S46912A Strain of unspecified muscle, fascia and tendon at shoulder and upper arm level, left arm, initial encounter: Secondary | ICD-10-CM | POA: Diagnosis present

## 2019-12-13 DIAGNOSIS — M6281 Muscle weakness (generalized): Secondary | ICD-10-CM | POA: Diagnosis present

## 2019-12-13 NOTE — Therapy (Signed)
[place]. [place], [place], [address] Phone: [phone]   Fax:  [phone]  Physical Therapy Treatment Progress Note Reporting Period 10/14/2019 to 12/13/2019  See note below for Objective Data and Assessment of Progress/Goals.      Patient Details  Name: Felicia Barron MRN: [id number] Date of Birth: 1965/08/08 Referring Provider (PT): Felicia Limbo, MD   Encounter Date: 12/13/2019   PT End of Session - 12/13/19 0958    Visit Number 10    Date for PT Re-Evaluation 01/02/20    PT Start Time 0929    PT Stop Time 1013    PT Time Calculation (min) 44 min    Activity Tolerance Patient tolerated treatment well;Patient limited by pain    Behavior During Therapy [name] for tasks assessed/performed           Past Medical History:  Diagnosis Date   Anemia    Migraine    Skin cancer     Past Surgical History:  Procedure Laterality Date   CESAREAN SECTION     CHOLECYSTECTOMY     WISDOM TOOTH EXTRACTION      There were no vitals filed for this visit.   Subjective Assessment - 12/13/19 0933    Subjective Pt states that shoulder is the same; still very sore, painful, and stiff. Pt states that Tramadol is helping with sleep. Reports trouble driving; driving with one hand.    Currently in Pain? Yes    Pain Score 5     Pain Location Shoulder    Pain Orientation Left              [facility] PT Assessment - 12/13/19 0001      Assessment   Next MD Visit 12/15/2019                         [facility] Adult PT Treatment/Exercise - 12/13/19 0001      Shoulder Exercises: ROM/Strengthening   UBE (Upper Arm Bike) L3 3 min fwd/3 min bkwd    Nustep L3 x 6 min      Shoulder Exercises: Stretch   Table Stretch - Flexion 5 reps;10 seconds    Table Stretch - Abduction 5 reps;10 seconds      Modalities   Modalities Vasopneumatic      Vasopneumatic   Number Minutes Vasopneumatic  15 minutes     Vasopnuematic Location  Shoulder    Vasopneumatic Pressure Medium    Vasopneumatic Temperature  34      Manual Therapy   Manual Therapy Passive ROM    Passive ROM very gentle L shoulder PROM all directions                    PT Short Term Goals - 10/28/19 1129      PT SHORT TERM GOAL #1   Title Pt will be independent and compliant with initial HEP.    Time 1    Period Weeks    Status Achieved    Target Date 10/21/19      PT SHORT TERM GOAL #2   Title Pt will increase shoulder flexion/abduction ROM at least 10 each.    Time 3    Period Weeks    Status Partially Met    Target Date 11/04/19      PT SHORT TERM GOAL #3   Title Pt will only wake up 1-2x/night secondary to L shoulder pain.  Baseline 3-4x    Time 3    Period Weeks    Status On-going    Target Date 11/04/19             PT Long Term Goals - 12/13/19 0959      PT LONG TERM GOAL #1   Title Pt will decrease FOTO disability to 37%, demonstrating a significant change in perceived functional ability.    Baseline 66% disability    Time 6    Period Weeks    Status On-going      PT LONG TERM GOAL #2   Title Pt will increase true flexion/abduction ROM to >130 with <2/10 pain at most.    Baseline ROM decreasing    Time 6    Period Weeks    Status On-going      PT LONG TERM GOAL #3   Title Pt will increase L shoulder MMT to 4+/5 without pain.    Baseline MMT innacurate d/t pain    Time 6    Period Weeks    Status On-going      PT LONG TERM GOAL #4   Title Pt will be able to perform all ADLs independently, including don/doff bra and zipping up shirts/dresses.    Baseline husband helps    Time 6    Period Weeks    Status On-going      PT LONG TERM GOAL #5   Title Pt will sleep through the night without being awoken with L shoulder pain.    Baseline taking Tramadol to help    Time 6    Period Weeks    Status Partially Met                 Plan - 12/13/19 1000    Clinical  Impression Statement Pt continues to demo increasing difficulty with ADLs such as driving, dressing, and doing hair. Pt available active and passive ROM decreased since time of eval with increased L shoulder pain. Pt has MD appt Wed 12/15/19. Discussed with pt putting PT on hold until after MD appt and potential further imaging d/t lack of progress and decrease in ROM with pt VU and agreement.    PT Treatment/Interventions ADLs/Self Care Home Management;Joint Manipulations;Spinal Manipulations;Vasopneumatic Device;Manual techniques;Passive range of motion;Patient/family education;Neuromuscular re-education;Therapeutic activities;Therapeutic exercise;Cryotherapy;Moist Heat    PT Next Visit Plan pt on hold until after MD appt    Consulted and Agree with Plan of Care Patient           Patient will benefit from skilled therapeutic intervention in order to improve the following deficits and impairments:  Decreased mobility, Decreased strength, Impaired flexibility, Hypomobility, Impaired perceived functional ability, Pain, Impaired UE functional use, Decreased range of motion, Increased fascial restricitons  Visit Diagnosis: Stiffness of left shoulder, not elsewhere classified  Acute pain of left shoulder  Muscle weakness (generalized)  Strain of left shoulder, initial encounter     Problem List There are no problems to display for this patient.  Amador Cunas, PT, DPT Donald Prose Kashmere Daywalt 12/13/2019, 10:03 AM  [place]. [place], [place], [address] Phone: [phone]   Fax:  [phone]  Name: Felicia Barron MRN: [id number] Date of Birth: 01-15-1966

## 2019-12-16 ENCOUNTER — Ambulatory Visit: Payer: BC Managed Care – PPO | Admitting: Physical Therapy

## 2019-12-28 ENCOUNTER — Ambulatory Visit: Payer: BC Managed Care – PPO | Admitting: Physical Therapy

## 2019-12-28 ENCOUNTER — Other Ambulatory Visit: Payer: Self-pay

## 2019-12-28 ENCOUNTER — Encounter: Payer: Self-pay | Admitting: Physical Therapy

## 2019-12-28 DIAGNOSIS — M6281 Muscle weakness (generalized): Secondary | ICD-10-CM

## 2019-12-28 DIAGNOSIS — M25612 Stiffness of left shoulder, not elsewhere classified: Secondary | ICD-10-CM

## 2019-12-28 DIAGNOSIS — M25512 Pain in left shoulder: Secondary | ICD-10-CM

## 2019-12-28 NOTE — Therapy (Signed)
[place]. [place], [place], [address] Phone: [phone]   Fax:  [phone]  Physical Therapy Treatment  Patient Details  Name: Felicia Barron MRN: [id number] Date of Birth: Dec 06, 1965 Referring Provider (PT): Bernerd Limbo, MD   Encounter Date: 12/28/2019   PT End of Session - 12/28/19 0843    Visit Number 11    Date for PT Re-Evaluation 01/02/20    PT Start Time 0802    PT Stop Time 5427    PT Time Calculation (min) 52 min    Activity Tolerance Patient tolerated treatment well;Patient limited by pain    Behavior During Therapy [facility] for tasks assessed/performed           Past Medical History:  Diagnosis Date   Anemia    Migraine    Skin cancer     Past Surgical History:  Procedure Laterality Date   CESAREAN SECTION     CHOLECYSTECTOMY     WISDOM TOOTH EXTRACTION      There were no vitals filed for this visit.   Subjective Assessment - 12/28/19 0804    Subjective Pt reports that she went back to MD; was prescribed prednisone and meloxicam which has helped a lot with the pain. Had MRI which showed frozen shoulder; pt reports no tears on MRI. MD recommends four more weeks of PT.    Currently in Pain? Yes    Pain Score 1     Pain Location Shoulder    Pain Orientation Left              [facility] PT Assessment - 12/28/19 0001      AROM   Left Shoulder Flexion 75 Degrees    Left Shoulder ABduction 70 Degrees      PROM   Left Shoulder Flexion 110 Degrees    Left Shoulder ABduction 110 Degrees                         [facility] Adult PT Treatment/Exercise - 12/28/19 0001      Shoulder Exercises: ROM/Strengthening   UBE (Upper Arm Bike) L3 3 min fwd/3 min bkwd    Nustep L5 x 5 min      Shoulder Exercises: Stretch   Wall Stretch - Flexion 5 reps;10 seconds    Wall Stretch - Flexion Limitations partial ROM    Wall Stretch - ABduction 5 reps;10 seconds    Wall Stretch -  ABduction Limitations partial ROM      Vasopneumatic   Number Minutes Vasopneumatic  15 minutes    Vasopnuematic Location  Shoulder    Vasopneumatic Pressure Medium    Vasopneumatic Temperature  34      Manual Therapy   Manual Therapy Passive ROM    Joint Mobilization grade II-III L shoulder inf and post joint mobs; gentle distraction    Passive ROM very gentle L shoulder PROM all directions                    PT Short Term Goals - 10/28/19 1129      PT SHORT TERM GOAL #1   Title Pt will be independent and compliant with initial HEP.    Time 1    Period Weeks    Status Achieved    Target Date 10/21/19      PT SHORT TERM GOAL #2   Title Pt will increase shoulder flexion/abduction ROM at least 10 each.  Time 3    Period Weeks    Status Partially Met    Target Date 11/04/19      PT SHORT TERM GOAL #3   Title Pt will only wake up 1-2x/night secondary to L shoulder pain.    Baseline 3-4x    Time 3    Period Weeks    Status On-going    Target Date 11/04/19             PT Long Term Goals - 12/13/19 0959      PT LONG TERM GOAL #1   Title Pt will decrease FOTO disability to 37%, demonstrating a significant change in perceived functional ability.    Baseline 66% disability    Time 6    Period Weeks    Status On-going      PT LONG TERM GOAL #2   Title Pt will increase true flexion/abduction ROM to >130 with <2/10 pain at most.    Baseline ROM decreasing    Time 6    Period Weeks    Status On-going      PT LONG TERM GOAL #3   Title Pt will increase L shoulder MMT to 4+/5 without pain.    Baseline MMT innacurate d/t pain    Time 6    Period Weeks    Status On-going      PT LONG TERM GOAL #4   Title Pt will be able to perform all ADLs independently, including don/doff bra and zipping up shirts/dresses.    Baseline husband helps    Time 6    Period Weeks    Status On-going      PT LONG TERM GOAL #5   Title Pt will sleep through the night  without being awoken with L shoulder pain.    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 Per MD, 2x/week for 4 weeks of PT then a follow up. Pt still has very limited ROM but has not lost ROM since last rx 2 weeks ago. Instructed to continue ex's; try to gain as much motion/function as posssible over the next 4 weeks.    PT Treatment/Interventions ADLs/Self Care Home Management;Joint Manipulations;Spinal Manipulations;Vasopneumatic Device;Manual techniques;Passive range of motion;Patient/family education;Neuromuscular re-education;Therapeutic activities;Therapeutic exercise;Cryotherapy;Moist Heat    PT Next Visit Plan push ROM and function; strengthening    Consulted and Agree with Plan of Care Patient           Patient will benefit from skilled therapeutic intervention in order to improve the following deficits and impairments:  Decreased mobility, Decreased strength, Impaired flexibility, Hypomobility, Impaired perceived functional ability, Pain, Impaired UE functional use, Decreased range of motion, Increased fascial restricitons  Visit Diagnosis: Stiffness of left shoulder, not elsewhere classified  Acute pain of left shoulder  Muscle weakness (generalized)     Problem List There are no problems to display for this patient.  Amador Cunas, PT, DPT Donald Prose Damaria Stofko 12/28/2019, 8:54 AM  [place]. [place], [place], [address] Phone: [phone]   Fax:  [phone]  Name: Felicia Barron MRN: [id number] Date of Birth: 03/17/1965

## 2019-12-30 ENCOUNTER — Other Ambulatory Visit: Payer: Self-pay

## 2019-12-30 ENCOUNTER — Ambulatory Visit: Payer: BC Managed Care – PPO | Admitting: Physical Therapy

## 2019-12-30 DIAGNOSIS — M25612 Stiffness of left shoulder, not elsewhere classified: Secondary | ICD-10-CM

## 2019-12-30 DIAGNOSIS — M25512 Pain in left shoulder: Secondary | ICD-10-CM

## 2019-12-30 DIAGNOSIS — M6281 Muscle weakness (generalized): Secondary | ICD-10-CM

## 2019-12-30 NOTE — Therapy (Signed)
[place]. [place], [place], [address] Phone: [phone]   Fax:  [phone]  Physical Therapy Treatment  Patient Details  Name: Felicia Barron MRN: [id number] Date of Birth: October 21, 1965 Referring Provider (PT): Bernerd Limbo, MD   Encounter Date: 12/30/2019   PT End of Session - 12/30/19 1010    Visit Number 12    Date for PT Re-Evaluation 01/02/20    PT Start Time 0925    PT Stop Time 1010    PT Time Calculation (min) 45 min           Past Medical History:  Diagnosis Date  . Anemia   . Migraine   . Skin cancer     Past Surgical History:  Procedure Laterality Date  . CESAREAN SECTION    . CHOLECYSTECTOMY    . WISDOM TOOTH EXTRACTION      There were no vitals filed for this visit.   Subjective Assessment - 12/30/19 0932    Subjective sore after last session with stretching    Currently in Pain? Yes    Pain Score 1     Pain Location Shoulder    Pain Orientation Left                             [facility] Adult PT Treatment/Exercise - 12/30/19 0001      Shoulder Exercises: Supine   Other Supine Exercises 2# chest press into flex 10x    Other Supine Exercises 2# ER and IR 10 x      Shoulder Exercises: Sidelying   ABduction Strengthening;Left;10 reps;Weights    ABduction Weight (lbs) 2      Shoulder Exercises: ROM/Strengthening   UBE (Upper Arm Bike) L3 3 min fwd/3 min bkwd    Nustep L5 x 5 min    Lat Pull Limitations 15# 2x10      Manual Therapy   Manual Therapy Passive ROM    Joint Mobilization grade II-III L shoulder inf and post joint mobs; gentle distraction    Passive ROM  L shoulder PROM all directions   some CR                   PT Short Term Goals - 10/28/19 1129      PT SHORT TERM GOAL #1   Title Pt will be independent and compliant with initial HEP.    Time 1    Period Weeks    Status Achieved    Target Date 10/21/19      PT SHORT TERM  GOAL #2   Title Pt will increase shoulder flexion/abduction ROM at least 10 each.    Time 3    Period Weeks    Status Partially Met    Target Date 11/04/19      PT SHORT TERM GOAL #3   Title Pt will only wake up 1-2x/night secondary to L shoulder pain.    Baseline 3-4x    Time 3    Period Weeks    Status On-going    Target Date 11/04/19             PT Long Term Goals - 12/13/19 0959      PT LONG TERM GOAL #1   Title Pt will decrease FOTO disability to 37%, demonstrating a significant change in perceived functional ability.    Baseline 66% disability    Time 6    Period Weeks  Status On-going      PT LONG TERM GOAL #2   Title Pt will increase true flexion/abduction ROM to >130 with <2/10 pain at most.    Baseline ROM decreasing    Time 6    Period Weeks    Status On-going      PT LONG TERM GOAL #3   Title Pt will increase L shoulder MMT to 4+/5 without pain.    Baseline MMT innacurate d/t pain    Time 6    Period Weeks    Status On-going      PT LONG TERM GOAL #4   Title Pt will be able to perform all ADLs independently, including don/doff bra and zipping up shirts/dresses.    Baseline husband helps    Time 6    Period Weeks    Status On-going      PT LONG TERM GOAL #5   Title Pt will sleep through the night without being awoken with L shoulder pain.    Baseline taking Tramadol to help    Time 6    Period Weeks    Status Partially Met                 Plan - 12/30/19 1011    Clinical Impression Statement pt pain limited with ex and PROM but gives good effort. weakness noted with ex. cuing with PROM to relax. pt will ice at home    PT Treatment/Interventions ADLs/Self Care Home Management;Joint Manipulations;Spinal Manipulations;Vasopneumatic Device;Manual techniques;Passive range of motion;Patient/family education;Neuromuscular re-education;Therapeutic activities;Therapeutic exercise;Cryotherapy;Moist Heat    PT Next Visit Plan push ROM and  function; strengthening           Patient will benefit from skilled therapeutic intervention in order to improve the following deficits and impairments:  Decreased mobility, Decreased strength, Impaired flexibility, Hypomobility, Impaired perceived functional ability, Pain, Impaired UE functional use, Decreased range of motion, Increased fascial restricitons  Visit Diagnosis: Stiffness of left shoulder, not elsewhere classified  Acute pain of left shoulder  Muscle weakness (generalized)     Problem List There are no problems to display for this patient.   Felicia Barron,ANGIE PTA 12/30/2019, 10:13 AM  [place]. [place], [place], [address] Phone: [phone]   Fax:  [phone]  Name: Felicia Barron MRN: [id number] Date of Birth: [birth year]

## 2020-01-04 ENCOUNTER — Encounter: Payer: Self-pay | Admitting: Physical Therapy

## 2020-01-04 ENCOUNTER — Ambulatory Visit: Payer: BC Managed Care – PPO | Admitting: Physical Therapy

## 2020-01-04 ENCOUNTER — Other Ambulatory Visit: Payer: Self-pay

## 2020-01-04 DIAGNOSIS — M25612 Stiffness of left shoulder, not elsewhere classified: Secondary | ICD-10-CM

## 2020-01-04 DIAGNOSIS — M6281 Muscle weakness (generalized): Secondary | ICD-10-CM

## 2020-01-04 DIAGNOSIS — M25512 Pain in left shoulder: Secondary | ICD-10-CM

## 2020-01-04 NOTE — Therapy (Signed)
[place]. [place], [place], [address] Phone: [phone]   Fax:  [phone]  Physical Therapy Treatment  Patient Details  Name: Felicia Barron MRN: [id number] Date of Birth: [birth year] Referring Provider (PT): Bernerd Limbo, MD   Encounter Date: 01/04/2020   PT End of Session - 01/04/20 1015    Visit Number 13    Date for PT Re-Evaluation 02/04/20    PT Start Time 0927    PT Stop Time 1013    PT Time Calculation (min) 46 min    Activity Tolerance Patient tolerated treatment well;Patient limited by pain    Behavior During Therapy [facility] for tasks assessed/performed           Past Medical History:  Diagnosis Date  . Anemia   . Migraine   . Skin cancer     Past Surgical History:  Procedure Laterality Date  . CESAREAN SECTION    . CHOLECYSTECTOMY    . WISDOM TOOTH EXTRACTION      There were no vitals filed for this visit.   Subjective Assessment - 01/04/20 0929    Subjective Pt states that stretching last session was very helpful    Currently in Pain? Yes    Pain Score 1     Pain Location Shoulder    Pain Orientation Left                             [facility] Adult PT Treatment/Exercise - 01/04/20 0001      Shoulder Exercises: Supine   Other Supine Exercises 2# chest press into flex 2x10      Shoulder Exercises: Seated   Other Seated Exercises seated abd table slides x10      Shoulder Exercises: Sidelying   External Rotation Left;10 reps    External Rotation Weight (lbs) 2    ABduction Left;10 reps    ABduction Weight (lbs) 2      Shoulder Exercises: Standing   Other Standing Exercises standing flexion wall slides x10      Shoulder Exercises: ROM/Strengthening   UBE (Upper Arm Bike) L3 3 min fwd/3 min bkwd    Nustep L5 x 6 min    Lat Pull Limitations 15# 2x10 w limited ROM      Manual Therapy   Manual Therapy Passive ROM    Joint Mobilization grade II-III L  shoulder inf and post joint mobs; gentle distraction    Passive ROM  L shoulder PROM all directions                    PT Short Term Goals - 10/28/19 1129      PT SHORT TERM GOAL #1   Title Pt will be independent and compliant with initial HEP.    Time 1    Period Weeks    Status Achieved    Target Date 10/21/19      PT SHORT TERM GOAL #2   Title Pt will increase shoulder flexion/abduction ROM at least 10 each.    Time 3    Period Weeks    Status Partially Met    Target Date 11/04/19      PT SHORT TERM GOAL #3   Title Pt will only wake up 1-2x/night secondary to L shoulder pain.    Baseline 3-4x    Time 3    Period Weeks    Status On-going    Target Date  11/04/19             PT Long Term Goals - 12/13/19 0959      PT LONG TERM GOAL #1   Title Pt will decrease FOTO disability to 37%, demonstrating a significant change in perceived functional ability.    Baseline 66% disability    Time 6    Period Weeks    Status On-going      PT LONG TERM GOAL #2   Title Pt will increase true flexion/abduction ROM to >130 with <2/10 pain at most.    Baseline ROM decreasing    Time 6    Period Weeks    Status On-going      PT LONG TERM GOAL #3   Title Pt will increase L shoulder MMT to 4+/5 without pain.    Baseline MMT innacurate d/t pain    Time 6    Period Weeks    Status On-going      PT LONG TERM GOAL #4   Title Pt will be able to perform all ADLs independently, including don/doff bra and zipping up shirts/dresses.    Baseline husband helps    Time 6    Period Weeks    Status On-going      PT LONG TERM GOAL #5   Title Pt will sleep through the night without being awoken with L shoulder pain.    Baseline taking Tramadol to help    Time 6    Period Weeks    Status Partially Met                 Plan - 01/04/20 1015    Clinical Impression Statement Pt reports slightly improved ROM following manual therapy last visit. More aggressive with  manual therapy and joint mobs today. Pt demos decreased ROM with exercise and pain at end range. Assess response to passive ROM and joint mobs next rx.    PT Treatment/Interventions ADLs/Self Care Home Management;Joint Manipulations;Spinal Manipulations;Vasopneumatic Device;Manual techniques;Passive range of motion;Patient/family education;Neuromuscular re-education;Therapeutic activities;Therapeutic exercise;Cryotherapy;Moist Heat    PT Next Visit Plan push ROM and function; strengthening    Consulted and Agree with Plan of Care Patient           Patient will benefit from skilled therapeutic intervention in order to improve the following deficits and impairments:  Decreased mobility, Decreased strength, Impaired flexibility, Hypomobility, Impaired perceived functional ability, Pain, Impaired UE functional use, Decreased range of motion, Increased fascial restricitons  Visit Diagnosis: Stiffness of left shoulder, not elsewhere classified  Acute pain of left shoulder  Muscle weakness (generalized)     Problem List There are no problems to display for this patient.  Amador Cunas, PT, DPT [facility]se Jet Armbrust 01/04/2020, 10:17 AM  [place]. [facility], [place], [address] Phone: [phone]   Fax:  [phone]  Name: Felicia Barron MRN: [id number] Date of Birth: March 25, 1965

## 2020-01-07 ENCOUNTER — Encounter: Payer: Self-pay | Admitting: Physical Therapy

## 2020-01-07 ENCOUNTER — Ambulatory Visit: Payer: BC Managed Care – PPO | Admitting: Physical Therapy

## 2020-01-07 ENCOUNTER — Other Ambulatory Visit: Payer: Self-pay

## 2020-01-07 DIAGNOSIS — M25512 Pain in left shoulder: Secondary | ICD-10-CM

## 2020-01-07 DIAGNOSIS — M25612 Stiffness of left shoulder, not elsewhere classified: Secondary | ICD-10-CM | POA: Diagnosis not present

## 2020-01-07 DIAGNOSIS — S46912A Strain of unspecified muscle, fascia and tendon at shoulder and upper arm level, left arm, initial encounter: Secondary | ICD-10-CM

## 2020-01-07 DIAGNOSIS — M6281 Muscle weakness (generalized): Secondary | ICD-10-CM

## 2020-01-07 NOTE — Therapy (Signed)
[place]. [place], [place], [address] Phone: [phone]   Fax:  [phone]  Physical Therapy Treatment  Patient Details  Name: Felicia Barron MRN: [id number] Date of Birth: [birth year] Referring Provider (PT): Bernerd Limbo, MD   Encounter Date: 01/07/2020   PT End of Session - 01/07/20 1056    Visit Number 14    Authorization Type BCBS    PT Start Time 7782    PT Stop Time 1057    PT Time Calculation (min) 43 min    Activity Tolerance Patient tolerated treatment well;Patient limited by pain    Behavior During Therapy [facility] for tasks assessed/performed           Past Medical History:  Diagnosis Date  . Anemia   . Migraine   . Skin cancer     Past Surgical History:  Procedure Laterality Date  . CESAREAN SECTION    . CHOLECYSTECTOMY    . WISDOM TOOTH EXTRACTION      There were no vitals filed for this visit.   Subjective Assessment - 01/07/20 1018    Subjective Pt states she noticed a little ROM improvement after stretching the past couple of treatments    Currently in Pain? Yes    Pain Score 1     Pain Location Shoulder    Pain Orientation Left                             [facility] Adult PT Treatment/Exercise - 01/07/20 0001      Shoulder Exercises: Supine   Other Supine Exercises 2# chest press into flex 1x10      Shoulder Exercises: Standing   External Rotation Left;20 reps    Theraband Level (Shoulder External Rotation) Level 2 (Red)    Internal Rotation Left;20 reps    Theraband Level (Shoulder Internal Rotation) Level 2 (Red)    Extension Both;20 reps;Theraband    Theraband Level (Shoulder Extension) Level 2 (Red)    Row Both;20 reps;Theraband    Theraband Level (Shoulder Row) Level 2 (Red)    Other Standing Exercises standing flex/abd wall slides x10    Other Standing Exercises weight bar 1# ext and IR 1x10      Shoulder Exercises: ROM/Strengthening   UBE  (Upper Arm Bike) L3 3 min fwd/3 min bkwd      Manual Therapy   Manual Therapy Passive ROM    Joint Mobilization grade II-III L shoulder inf and post joint mobs; gentle distraction    Passive ROM  L shoulder PROM all directions                    PT Short Term Goals - 10/28/19 1129      PT SHORT TERM GOAL #1   Title Pt will be independent and compliant with initial HEP.    Time 1    Period Weeks    Status Achieved    Target Date 10/21/19      PT SHORT TERM GOAL #2   Title Pt will increase shoulder flexion/abduction ROM at least 10 each.    Time 3    Period Weeks    Status Partially Met    Target Date 11/04/19      PT SHORT TERM GOAL #3   Title Pt will only wake up 1-2x/night secondary to L shoulder pain.    Baseline 3-4x    Time 3  Period Weeks    Status On-going    Target Date 11/04/19             PT Long Term Goals - 12/13/19 0959      PT LONG TERM GOAL #1   Title Pt will decrease FOTO disability to 37%, demonstrating a significant change in perceived functional ability.    Baseline 66% disability    Time 6    Period Weeks    Status On-going      PT LONG TERM GOAL #2   Title Pt will increase true flexion/abduction ROM to >130 with <2/10 pain at most.    Baseline ROM decreasing    Time 6    Period Weeks    Status On-going      PT LONG TERM GOAL #3   Title Pt will increase L shoulder MMT to 4+/5 without pain.    Baseline MMT innacurate d/t pain    Time 6    Period Weeks    Status On-going      PT LONG TERM GOAL #4   Title Pt will be able to perform all ADLs independently, including don/doff bra and zipping up shirts/dresses.    Baseline husband helps    Time 6    Period Weeks    Status On-going      PT LONG TERM GOAL #5   Title Pt will sleep through the night without being awoken with L shoulder pain.    Baseline taking Tramadol to help    Time 6    Period Weeks    Status Partially Met                 Plan - 01/07/20  1057    Clinical Impression Statement Pt continues to report slight improvement in functional ROM; able to reach up to hair in shower and reports able to lift L arm to reach controls in car; was unable to do this before. Pt able to tolerate increased range with PROM and joint mobs today. Continue to push ROM and function.    PT Treatment/Interventions ADLs/Self Care Home Management;Joint Manipulations;Spinal Manipulations;Vasopneumatic Device;Manual techniques;Passive range of motion;Patient/family education;Neuromuscular re-education;Therapeutic activities;Therapeutic exercise;Cryotherapy;Moist Heat    PT Next Visit Plan push ROM and function; strengthening    Consulted and Agree with Plan of Care Patient           Patient will benefit from skilled therapeutic intervention in order to improve the following deficits and impairments:  Decreased mobility, Decreased strength, Impaired flexibility, Hypomobility, Impaired perceived functional ability, Pain, Impaired UE functional use, Decreased range of motion, Increased fascial restricitons  Visit Diagnosis: Stiffness of left shoulder, not elsewhere classified  Acute pain of left shoulder  Muscle weakness (generalized)  Strain of left shoulder, initial encounter     Problem List There are no problems to display for this patient.  Amador Cunas, PT, DPT [facility]se [facility] Durante 01/07/2020, 10:59 AM  [place]. [place], [place], [address] Phone: [phone]   Fax:  [phone]  Name: Tarika Mckethan MRN: [id number] Date of Birth: 08/29/1965

## 2020-01-11 ENCOUNTER — Other Ambulatory Visit: Payer: Self-pay

## 2020-01-11 ENCOUNTER — Ambulatory Visit: Payer: BC Managed Care – PPO | Attending: Family Medicine | Admitting: Physical Therapy

## 2020-01-11 ENCOUNTER — Encounter: Payer: Self-pay | Admitting: Physical Therapy

## 2020-01-11 DIAGNOSIS — M25612 Stiffness of left shoulder, not elsewhere classified: Secondary | ICD-10-CM

## 2020-01-11 DIAGNOSIS — M6281 Muscle weakness (generalized): Secondary | ICD-10-CM

## 2020-01-11 DIAGNOSIS — S46912A Strain of unspecified muscle, fascia and tendon at shoulder and upper arm level, left arm, initial encounter: Secondary | ICD-10-CM

## 2020-01-11 DIAGNOSIS — M25512 Pain in left shoulder: Secondary | ICD-10-CM

## 2020-01-11 NOTE — Therapy (Signed)
[place]. [place], [place], [address] Phone: [phone]   Fax:  [phone]  Physical Therapy Treatment  Patient Details  Name: Sheika Coutts MRN: [id number] Date of Birth: [birth year] Referring Provider (PT): Bernerd Limbo, MD   Encounter Date: 01/11/2020   PT End of Session - 01/11/20 0855    Visit Number 15    Number of Visits 30    Date for PT Re-Evaluation 02/04/20    Authorization Type BCBS    PT Start Time 0801    PT Stop Time 0845    PT Time Calculation (min) 44 min    Activity Tolerance Patient tolerated treatment well    Behavior During Therapy [facility] for tasks assessed/performed           Past Medical History:  Diagnosis Date  . Anemia   . Migraine   . Skin cancer     Past Surgical History:  Procedure Laterality Date  . CESAREAN SECTION    . CHOLECYSTECTOMY    . WISDOM TOOTH EXTRACTION      There were no vitals filed for this visit.   Subjective Assessment - 01/11/20 0803    Subjective Pt states feeling good this morning    Currently in Pain? Yes    Pain Score 2     Pain Location Shoulder    Pain Orientation Left                             [facility] Adult PT Treatment/Exercise - 01/11/20 0001      Shoulder Exercises: ROM/Strengthening   UBE (Upper Arm Bike) L3 3 min fwd/3 min bkwd    Nustep L5 x 6 min    Lat Pull Limitations 10# 2x10 with limited ROM    Cybex Press Limitations 5# 2x10    Cybex Row Limitations 10# 2x10      Manual Therapy   Manual Therapy Passive ROM    Joint Mobilization grade II-III L shoulder inf and post joint mobs; gentle distraction    Passive ROM  L shoulder PROM all directions                    PT Short Term Goals - 10/28/19 1129      PT SHORT TERM GOAL #1   Title Pt will be independent and compliant with initial HEP.    Time 1    Period Weeks    Status Achieved    Target Date 10/21/19      PT SHORT TERM GOAL #2    Title Pt will increase shoulder flexion/abduction ROM at least 10 each.    Time 3    Period Weeks    Status Partially Met    Target Date 11/04/19      PT SHORT TERM GOAL #3   Title Pt will only wake up 1-2x/night secondary to L shoulder pain.    Baseline 3-4x    Time 3    Period Weeks    Status On-going    Target Date 11/04/19             PT Long Term Goals - 12/13/19 0959      PT LONG TERM GOAL #1   Title Pt will decrease FOTO disability to 37%, demonstrating a significant change in perceived functional ability.    Baseline 66% disability    Time 6    Period Weeks  Status On-going      PT LONG TERM GOAL #2   Title Pt will increase true flexion/abduction ROM to >130 with <2/10 pain at most.    Baseline ROM decreasing    Time 6    Period Weeks    Status On-going      PT LONG TERM GOAL #3   Title Pt will increase L shoulder MMT to 4+/5 without pain.    Baseline MMT innacurate d/t pain    Time 6    Period Weeks    Status On-going      PT LONG TERM GOAL #4   Title Pt will be able to perform all ADLs independently, including don/doff bra and zipping up shirts/dresses.    Baseline husband helps    Time 6    Period Weeks    Status On-going      PT LONG TERM GOAL #5   Title Pt will sleep through the night without being awoken with L shoulder pain.    Baseline taking Tramadol to help    Time 6    Period Weeks    Status Partially Met                 Plan - 01/11/20 0857    Clinical Impression Statement Pt required cuing for posture/form with rows and standing shoulder extension. Decreased weight on lat pull d/t ROM further limited and increased pain/stiffness reported this rx. Frequent cuing for muscle relaxation during passive ROM and joint mobs this rx. Continue to push ROM and function.    PT Treatment/Interventions ADLs/Self Care Home Management;Joint Manipulations;Spinal Manipulations;Vasopneumatic Device;Manual techniques;Passive range of  motion;Patient/family education;Neuromuscular re-education;Therapeutic activities;Therapeutic exercise;Cryotherapy;Moist Heat    PT Next Visit Plan push ROM and function; strengthening    Consulted and Agree with Plan of Care Patient           Patient will benefit from skilled therapeutic intervention in order to improve the following deficits and impairments:  Decreased mobility, Decreased strength, Impaired flexibility, Hypomobility, Impaired perceived functional ability, Pain, Impaired UE functional use, Decreased range of motion, Increased fascial restricitons  Visit Diagnosis: Stiffness of left shoulder, not elsewhere classified  Acute pain of left shoulder  Muscle weakness (generalized)  Strain of left shoulder, initial encounter     Problem List There are no problems to display for this patient.  Amador Cunas, PT, DPT Donald Prose Bev Drennen 01/11/2020, 9:00 AM  [place]. [place], [place], [address] Phone: [phone]   Fax:  [phone]  Name: Lameshia Hypolite MRN: [id number] Date of Birth: Jul 22, 1965

## 2020-01-13 ENCOUNTER — Ambulatory Visit: Payer: BC Managed Care – PPO | Admitting: Physical Therapy

## 2020-01-13 ENCOUNTER — Encounter: Payer: Self-pay | Admitting: Physical Therapy

## 2020-01-13 ENCOUNTER — Other Ambulatory Visit: Payer: Self-pay

## 2020-01-13 DIAGNOSIS — M25512 Pain in left shoulder: Secondary | ICD-10-CM

## 2020-01-13 DIAGNOSIS — M6281 Muscle weakness (generalized): Secondary | ICD-10-CM

## 2020-01-13 DIAGNOSIS — S46912A Strain of unspecified muscle, fascia and tendon at shoulder and upper arm level, left arm, initial encounter: Secondary | ICD-10-CM

## 2020-01-13 DIAGNOSIS — M25612 Stiffness of left shoulder, not elsewhere classified: Secondary | ICD-10-CM | POA: Diagnosis not present

## 2020-01-13 NOTE — Therapy (Signed)
[facility] [address] [place], [place], [address] Phone: [phone]   Fax:  [phone]  Physical Therapy Treatment  Patient Details  Name: Felicia Barron MRN: [id number] Date of Birth: 1965-11-29 Referring Provider (PT): Tracey Harries, MD   Encounter Date: 01/13/2020   PT End of Session - 01/13/20 0845    Visit Number 16    Number of Visits 30    Date for PT Re-Evaluation 02/04/20    Authorization Type BCBS    PT Start Time 0800    PT Stop Time 0845    PT Time Calculation (min) 45 min    Activity Tolerance Patient tolerated treatment well    Behavior During Therapy [facility] for tasks assessed/performed           Past Medical History:  Diagnosis Date  . Anemia   . Migraine   . Skin cancer     Past Surgical History:  Procedure Laterality Date  . CESAREAN SECTION    . CHOLECYSTECTOMY    . WISDOM TOOTH EXTRACTION      There were no vitals filed for this visit.   Subjective Assessment - 01/13/20 0807    Subjective Pt states feeling good    Currently in Pain? Yes    Pain Score 1     Pain Location Shoulder    Pain Orientation Left                             [facility] Adult PT Treatment/Exercise - 01/13/20 0001      Shoulder Exercises: Supine   Other Supine Exercises 2# chest press into flex 1x10      Shoulder Exercises: Standing   Extension Both;20 reps    Theraband Level (Shoulder Extension) Level 3 (Green)    Other Standing Exercises weight bar 2# ext/IR 1x10      Shoulder Exercises: ROM/Strengthening   UBE (Upper Arm Bike) L3 3 min fwd/3 min bkwd    Lat Pull Limitations 10# 2x10 with limited ROM    Cybex Press Limitations 5# 2x10    Cybex Row Limitations 10# 2x10      Shoulder Exercises: Stretch   Corner Stretch 1 rep;30 seconds    External Rotation Stretch 1 rep;30 seconds    Wall Stretch - Flexion 5 reps;10 seconds    Wall Stretch - ABduction 5 reps;10 seconds    Other  Shoulder Stretches bicep stretch x20 sec      Manual Therapy   Manual Therapy Passive ROM    Joint Mobilization grade II-III L shoulder inf and post joint mobs; gentle distraction    Passive ROM  L shoulder PROM all directions                    PT Short Term Goals - 10/28/19 1129      PT SHORT TERM GOAL #1   Title Pt will be independent and compliant with initial HEP.    Time 1    Period Weeks    Status Achieved    Target Date 10/21/19      PT SHORT TERM GOAL #2   Title Pt will increase shoulder flexion/abduction ROM at least 10 each.    Time 3    Period Weeks    Status Partially Met    Target Date 11/04/19      PT SHORT TERM GOAL #3   Title Pt will only wake up 1-2x/night  secondary to L shoulder pain.    Baseline 3-4x    Time 3    Period Weeks    Status On-going    Target Date 11/04/19             PT Long Term Goals - 12/13/19 0959      PT LONG TERM GOAL #1   Title Pt will decrease FOTO disability to 37%, demonstrating a significant change in perceived functional ability.    Baseline 66% disability    Time 6    Period Weeks    Status On-going      PT LONG TERM GOAL #2   Title Pt will increase true flexion/abduction ROM to >130 with <2/10 pain at most.    Baseline ROM decreasing    Time 6    Period Weeks    Status On-going      PT LONG TERM GOAL #3   Title Pt will increase L shoulder MMT to 4+/5 without pain.    Baseline MMT innacurate d/t pain    Time 6    Period Weeks    Status On-going      PT LONG TERM GOAL #4   Title Pt will be able to perform all ADLs independently, including don/doff bra and zipping up shirts/dresses.    Baseline husband helps    Time 6    Period Weeks    Status On-going      PT LONG TERM GOAL #5   Title Pt will sleep through the night without being awoken with L shoulder pain.    Baseline taking Tramadol to help    Time 6    Period Weeks    Status Partially Met                 Plan - 01/13/20  0846    Clinical Impression Statement Pt demos increased tolerance to machine interventions this rx. Partial ROM on lat pulldowns d/t ROM restrictions. Reinforced education on stretching for HEP with pt demo and VU. Frequent cues for muscle relaxation during passive ROM d/t pain/guarding.    PT Treatment/Interventions ADLs/Self Care Home Management;Joint Manipulations;Spinal Manipulations;Vasopneumatic Device;Manual techniques;Passive range of motion;Patient/family education;Neuromuscular re-education;Therapeutic activities;Therapeutic exercise;Cryotherapy;Moist Heat    PT Next Visit Plan push ROM and function; strengthening    Consulted and Agree with Plan of Care Patient           Patient will benefit from skilled therapeutic intervention in order to improve the following deficits and impairments:  Decreased mobility, Decreased strength, Impaired flexibility, Hypomobility, Impaired perceived functional ability, Pain, Impaired UE functional use, Decreased range of motion, Increased fascial restricitons  Visit Diagnosis: Stiffness of left shoulder, not elsewhere classified  Acute pain of left shoulder  Muscle weakness (generalized)  Strain of left shoulder, initial encounter     Problem List There are no problems to display for this patient.  Amador Cunas, PT, DPT Donald Prose Tariq Pernell 01/13/2020, 8:47 AM  [facility]. [place], [place], [address] Phone: [phone]   Fax:  [phone]  Name: Felicia Barron MRN: [id number] Date of Birth: 14-Feb-1966

## 2020-01-18 ENCOUNTER — Encounter: Payer: Self-pay | Admitting: Physical Therapy

## 2020-01-18 ENCOUNTER — Other Ambulatory Visit: Payer: Self-pay

## 2020-01-18 ENCOUNTER — Ambulatory Visit: Payer: BC Managed Care – PPO | Admitting: Physical Therapy

## 2020-01-18 DIAGNOSIS — M25612 Stiffness of left shoulder, not elsewhere classified: Secondary | ICD-10-CM | POA: Diagnosis not present

## 2020-01-18 DIAGNOSIS — M25512 Pain in left shoulder: Secondary | ICD-10-CM

## 2020-01-18 DIAGNOSIS — S46912A Strain of unspecified muscle, fascia and tendon at shoulder and upper arm level, left arm, initial encounter: Secondary | ICD-10-CM

## 2020-01-18 DIAGNOSIS — M6281 Muscle weakness (generalized): Secondary | ICD-10-CM

## 2020-01-18 NOTE — Therapy (Signed)
[place]. [place], [place], [address] Phone: [phone]   Fax:  [phone]  Physical Therapy Treatment  Patient Details  Name: Felicia Barron MRN: [id number] Date of Birth: 28-Apr-1965 Referring Provider (PT): Bernerd Limbo, MD   Encounter Date: 01/18/2020   PT End of Session - 01/18/20 1409    Visit Number 17    Date for PT Re-Evaluation 02/04/20    Authorization Type BCBS    PT Start Time 1315    PT Stop Time 1400    PT Time Calculation (min) 45 min    Activity Tolerance Patient tolerated treatment well;Patient limited by pain    Behavior During Therapy [facility] for tasks assessed/performed           Past Medical History:  Diagnosis Date  . Anemia   . Migraine   . Skin cancer     Past Surgical History:  Procedure Laterality Date  . CESAREAN SECTION    . CHOLECYSTECTOMY    . WISDOM TOOTH EXTRACTION      There were no vitals filed for this visit.   Subjective Assessment - 01/18/20 1319    Subjective Pt states shoulder is a little sore; has been doing a lot of cleaning lately to prepare for guests    Currently in Pain? Yes    Pain Score 5     Pain Location Shoulder    Pain Orientation Left                             [facility] Adult PT Treatment/Exercise - 01/18/20 0001      Shoulder Exercises: Pulleys   Flexion 2 minutes    ABduction 2 minutes      Shoulder Exercises: ROM/Strengthening   UBE (Upper Arm Bike) L3 3 min fwd/3 min bkwd    Nustep L5 x 6 min    Lat Pull Limitations 10# 2x10 with limited ROM    Cybex Press Limitations 5# 2x10    Cybex Row Limitations 10# 2x10    Ball on Wall x 10 with 5 sec hold      Manual Therapy   Manual Therapy Passive ROM    Joint Mobilization grade II-III L shoulder inf and post joint mobs; gentle distraction    Passive ROM  L shoulder PROM all directions                    PT Short Term Goals - 10/28/19 1129      PT  SHORT TERM GOAL #1   Title Pt will be independent and compliant with initial HEP.    Time 1    Period Weeks    Status Achieved    Target Date 10/21/19      PT SHORT TERM GOAL #2   Title Pt will increase shoulder flexion/abduction ROM at least 10 each.    Time 3    Period Weeks    Status Partially Met    Target Date 11/04/19      PT SHORT TERM GOAL #3   Title Pt will only wake up 1-2x/night secondary to L shoulder pain.    Baseline 3-4x    Time 3    Period Weeks    Status On-going    Target Date 11/04/19             PT Long Term Goals - 12/13/19 0959      PT LONG  TERM GOAL #1   Title Pt will decrease FOTO disability to 37%, demonstrating a significant change in perceived functional ability.    Baseline 66% disability    Time 6    Period Weeks    Status On-going      PT LONG TERM GOAL #2   Title Pt will increase true flexion/abduction ROM to >130 with <2/10 pain at most.    Baseline ROM decreasing    Time 6    Period Weeks    Status On-going      PT LONG TERM GOAL #3   Title Pt will increase L shoulder MMT to 4+/5 without pain.    Baseline MMT innacurate d/t pain    Time 6    Period Weeks    Status On-going      PT LONG TERM GOAL #4   Title Pt will be able to perform all ADLs independently, including don/doff bra and zipping up shirts/dresses.    Baseline husband helps    Time 6    Period Weeks    Status On-going      PT LONG TERM GOAL #5   Title Pt will sleep through the night without being awoken with L shoulder pain.    Baseline taking Tramadol to help    Time 6    Period Weeks    Status Partially Met                 Plan - 01/18/20 1410    Clinical Impression Statement Discussed plans for continuation of PT with pt this rx. Current plan is to continue manual rx for 2 more weeks; pt will follow up with MD at that time and d/c with updated stretches and HEP. Trialed pulleys and pt may purchase over door pulley at home for shoulder ROM. Take  ROM measurements next rx.    PT Treatment/Interventions ADLs/Self Care Home Management;Joint Manipulations;Spinal Manipulations;Vasopneumatic Device;Manual techniques;Passive range of motion;Patient/family education;Neuromuscular re-education;Therapeutic activities;Therapeutic exercise;Cryotherapy;Moist Heat    PT Next Visit Plan push ROM and function; strengthening    Consulted and Agree with Plan of Care Patient           Patient will benefit from skilled therapeutic intervention in order to improve the following deficits and impairments:  Decreased mobility, Decreased strength, Impaired flexibility, Hypomobility, Impaired perceived functional ability, Pain, Impaired UE functional use, Decreased range of motion, Increased fascial restricitons  Visit Diagnosis: Stiffness of left shoulder, not elsewhere classified  Acute pain of left shoulder  Muscle weakness (generalized)  Strain of left shoulder, initial encounter     Problem List There are no problems to display for this patient.  Amador Cunas, PT, DPT Donald Prose Geanette Buonocore 01/18/2020, 2:13 PM  [place]. [place], [place], [address] Phone: [phone]   Fax:  [phone]  Name: Felicia Barron MRN: [id number] Date of Birth: 1965-05-20

## 2020-01-20 ENCOUNTER — Encounter: Payer: Self-pay | Admitting: Physical Therapy

## 2020-01-20 ENCOUNTER — Ambulatory Visit: Payer: BC Managed Care – PPO | Admitting: Physical Therapy

## 2020-01-20 ENCOUNTER — Other Ambulatory Visit: Payer: Self-pay

## 2020-01-20 DIAGNOSIS — M25512 Pain in left shoulder: Secondary | ICD-10-CM

## 2020-01-20 DIAGNOSIS — M6281 Muscle weakness (generalized): Secondary | ICD-10-CM

## 2020-01-20 DIAGNOSIS — M25612 Stiffness of left shoulder, not elsewhere classified: Secondary | ICD-10-CM

## 2020-01-20 NOTE — Therapy (Signed)
[place]. [place], [place], [address] Phone: [phone]   Fax:  [phone]  Physical Therapy Treatment  Patient Details  Name: Felicia Barron MRN: [id number] Date of Birth: [birth year] Referring Provider (PT): Bernerd Limbo, MD   Encounter Date: 01/20/2020   PT End of Session - 01/20/20 1018    Visit Number 18    Date for PT Re-Evaluation 02/04/20    PT Start Time 0930    PT Stop Time 6759    PT Time Calculation (min) 44 min    Activity Tolerance Patient tolerated treatment well;Patient limited by pain    Behavior During Therapy [facility] for tasks assessed/performed           Past Medical History:  Diagnosis Date  . Anemia   . Migraine   . Skin cancer     Past Surgical History:  Procedure Laterality Date  . CESAREAN SECTION    . CHOLECYSTECTOMY    . WISDOM TOOTH EXTRACTION      There were no vitals filed for this visit.   Subjective Assessment - 01/20/20 0932    Subjective Pt states shoulder is feeling better today    Currently in Pain? Yes    Pain Score 3     Pain Location Shoulder    Pain Orientation Left                             [facility] Adult PT Treatment/Exercise - 01/20/20 0001      Shoulder Exercises: Supine   Other Supine Exercises 2# chest press into flex 1x10    Other Supine Exercises AAROM abduction stretch with weight bar      Shoulder Exercises: Seated   External Rotation Both;10 reps    Theraband Level (Shoulder External Rotation) Level 2 (Red)    Diagonals Both;10 reps    Theraband Level (Shoulder Diagonals) Level 2 (Red)      Shoulder Exercises: Standing   Internal Rotation Left;20 reps    Theraband Level (Shoulder Internal Rotation) Level 2 (Red)    Extension Both;20 reps    Theraband Level (Shoulder Extension) Level 2 (Red)    Row Both;20 reps    Theraband Level (Shoulder Row) Level 2 (Red)      Shoulder Exercises: ROM/Strengthening   UBE  (Upper Arm Bike) L3 3 min fwd/3 min bkwd    Nustep L5 x 6 min      Shoulder Exercises: Stretch   Wall Stretch - Flexion 5 reps;10 seconds    Wall Stretch - ABduction 5 reps;10 seconds      Manual Therapy   Manual Therapy Passive ROM    Joint Mobilization grade II-III L shoulder inf and post joint mobs; gentle distraction    Passive ROM  L shoulder PROM all directions                    PT Short Term Goals - 10/28/19 1129      PT SHORT TERM GOAL #1   Title Pt will be independent and compliant with initial HEP.    Time 1    Period Weeks    Status Achieved    Target Date 10/21/19      PT SHORT TERM GOAL #2   Title Pt will increase shoulder flexion/abduction ROM at least 10 each.    Time 3    Period Weeks    Status Partially Met  Target Date 11/04/19      PT SHORT TERM GOAL #3   Title Pt will only wake up 1-2x/night secondary to L shoulder pain.    Baseline 3-4x    Time 3    Period Weeks    Status On-going    Target Date 11/04/19             PT Long Term Goals - 12/13/19 0959      PT LONG TERM GOAL #1   Title Pt will decrease FOTO disability to 37%, demonstrating a significant change in perceived functional ability.    Baseline 66% disability    Time 6    Period Weeks    Status On-going      PT LONG TERM GOAL #2   Title Pt will increase true flexion/abduction ROM to >130 with <2/10 pain at most.    Baseline ROM decreasing    Time 6    Period Weeks    Status On-going      PT LONG TERM GOAL #3   Title Pt will increase L shoulder MMT to 4+/5 without pain.    Baseline MMT innacurate d/t pain    Time 6    Period Weeks    Status On-going      PT LONG TERM GOAL #4   Title Pt will be able to perform all ADLs independently, including don/doff bra and zipping up shirts/dresses.    Baseline husband helps    Time 6    Period Weeks    Status On-going      PT LONG TERM GOAL #5   Title Pt will sleep through the night without being awoken with L  shoulder pain.    Baseline taking Tramadol to help    Time 6    Period Weeks    Status Partially Met                 Plan - 01/20/20 1018    Clinical Impression Statement Pt demos increased shaking and guarding with stretching into IR/ER this rx. Significant compensations with shoulder ex's with verbal and tactile cuing to reduce compensations. Pt plans to purchase pulley for at home shoulder ROM. Discussed return to MD following next 2 weeks of tx for follow up with pt VU.    PT Treatment/Interventions ADLs/Self Care Home Management;Joint Manipulations;Spinal Manipulations;Vasopneumatic Device;Manual techniques;Passive range of motion;Patient/family education;Neuromuscular re-education;Therapeutic activities;Therapeutic exercise;Cryotherapy;Moist Heat    PT Next Visit Plan push ROM and function; strengthening    Consulted and Agree with Plan of Care Patient           Patient will benefit from skilled therapeutic intervention in order to improve the following deficits and impairments:  Decreased mobility, Decreased strength, Impaired flexibility, Hypomobility, Impaired perceived functional ability, Pain, Impaired UE functional use, Decreased range of motion, Increased fascial restricitons  Visit Diagnosis: Stiffness of left shoulder, not elsewhere classified  Acute pain of left shoulder  Muscle weakness (generalized)     Problem List There are no problems to display for this patient.  Amador Cunas, PT, DPT Donald Prose Reynol Arnone 01/20/2020, 10:21 AM  [place]. [place], [place], [address] Phone: [phone]   Fax:  [phone]  Name: Felicia Barron MRN: [id number] Date of Birth: 1965/09/06

## 2020-01-26 ENCOUNTER — Other Ambulatory Visit: Payer: Self-pay

## 2020-01-26 ENCOUNTER — Ambulatory Visit: Payer: BC Managed Care – PPO | Admitting: Physical Therapy

## 2020-01-26 ENCOUNTER — Encounter: Payer: Self-pay | Admitting: Physical Therapy

## 2020-01-26 DIAGNOSIS — M25612 Stiffness of left shoulder, not elsewhere classified: Secondary | ICD-10-CM

## 2020-01-26 DIAGNOSIS — M6281 Muscle weakness (generalized): Secondary | ICD-10-CM

## 2020-01-26 NOTE — Therapy (Signed)
[place]. [place], [place], [address] Phone: [phone]   Fax:  [phone]  Physical Therapy Treatment  Patient Details  Name: Felicia Barron MRN: [id number] Date of Birth: June 10, 1965 Referring Provider (PT): Bernerd Limbo, MD   Encounter Date: 01/26/2020   PT End of Session - 01/26/20 1331    Visit Number 19    Date for PT Re-Evaluation 02/04/20    PT Start Time 1300    PT Stop Time 1343    PT Time Calculation (min) 43 min    Activity Tolerance Patient tolerated treatment well;Patient limited by pain    Behavior During Therapy [facility] for tasks assessed/performed           Past Medical History:  Diagnosis Date  . Anemia   . Migraine   . Skin cancer     Past Surgical History:  Procedure Laterality Date  . CESAREAN SECTION    . CHOLECYSTECTOMY    . WISDOM TOOTH EXTRACTION      There were no vitals filed for this visit.   Subjective Assessment - 01/26/20 1257    Subjective Shoulder feels the same, no pain just discomfort.    Currently in Pain? No/denies                             [facility] Adult PT Treatment/Exercise - 01/26/20 0001      Shoulder Exercises: Supine   Other Supine Exercises 2# chest press into flex 1x10      Shoulder Exercises: Sidelying   External Rotation Left;10 reps   3"   Other Sidelying Exercises Sleeper stretch 5x10"     shoulder at      Shoulder Exercises: Standing   Flexion 10 reps;AAROM;Left   Pillow case slides 3" hold   ABduction AAROM;Left;10 reps   Pillow case slides 3" hold   Other Standing Exercises Ball-on-wall flex/abd. 3x15"    #1 ball   Other Standing Exercises Finger ladder x5      Shoulder Exercises: ROM/Strengthening   UBE (Upper Arm Bike) L3 3 min fwd/3 min bkwd      Manual Therapy   Manual Therapy Passive ROM    Joint Mobilization grade II-III L shoulder inf and post joint mobs; gentle distraction    Passive ROM  L shoulder  PROM all directions                    PT Short Term Goals - 10/28/19 1129      PT SHORT TERM GOAL #1   Title Pt will be independent and compliant with initial HEP.    Time 1    Period Weeks    Status Achieved    Target Date 10/21/19      PT SHORT TERM GOAL #2   Title Pt will increase shoulder flexion/abduction ROM at least 10 each.    Time 3    Period Weeks    Status Partially Met    Target Date 11/04/19      PT SHORT TERM GOAL #3   Title Pt will only wake up 1-2x/night secondary to L shoulder pain.    Baseline 3-4x    Time 3    Period Weeks    Status On-going    Target Date 11/04/19             PT Long Term Goals - 12/13/19 0959      PT  LONG TERM GOAL #1   Title Pt will decrease FOTO disability to 37%, demonstrating a significant change in perceived functional ability.    Baseline 66% disability    Time 6    Period Weeks    Status On-going      PT LONG TERM GOAL #2   Title Pt will increase true flexion/abduction ROM to >130 with <2/10 pain at most.    Baseline ROM decreasing    Time 6    Period Weeks    Status On-going      PT LONG TERM GOAL #3   Title Pt will increase L shoulder MMT to 4+/5 without pain.    Baseline MMT innacurate d/t pain    Time 6    Period Weeks    Status On-going      PT LONG TERM GOAL #4   Title Pt will be able to perform all ADLs independently, including don/doff bra and zipping up shirts/dresses.    Baseline husband helps    Time 6    Period Weeks    Status On-going      PT LONG TERM GOAL #5   Title Pt will sleep through the night without being awoken with L shoulder pain.    Baseline taking Tramadol to help    Time 6    Period Weeks    Status Partially Met                 Plan - 01/26/20 1331    Clinical Impression Statement Patient's shoulder ROM is still significantly limited with muscular fatigue due to capsular tightness. Needed verbal and tacile cues for all flexion and abduction exercises to  prevent shoulder elevation compensation.    PT Treatment/Interventions ADLs/Self Care Home Management;Joint Manipulations;Spinal Manipulations;Vasopneumatic Device;Manual techniques;Passive range of motion;Patient/family education;Neuromuscular re-education;Therapeutic activities;Therapeutic exercise;Cryotherapy;Moist Heat    PT Next Visit Plan push ROM and function; strengthening           Patient will benefit from skilled therapeutic intervention in order to improve the following deficits and impairments:  Decreased mobility, Decreased strength, Impaired flexibility, Hypomobility, Impaired perceived functional ability, Pain, Impaired UE functional use, Decreased range of motion, Increased fascial restricitons  Visit Diagnosis: Stiffness of left shoulder, not elsewhere classified  Muscle weakness (generalized)     Problem List There are no problems to display for this patient.   Lavenia Atlas, SPTA 01/26/2020, 1:46 PM  [place]. [place], [place], [address] Phone: [phone]   Fax:  [phone]  Name: Felicia Barron MRN: [id number] Date of Birth: [birth year]

## 2020-01-28 ENCOUNTER — Encounter: Payer: Self-pay | Admitting: Physical Therapy

## 2020-01-28 ENCOUNTER — Other Ambulatory Visit: Payer: Self-pay

## 2020-01-28 ENCOUNTER — Ambulatory Visit: Payer: BC Managed Care – PPO | Admitting: Physical Therapy

## 2020-01-28 DIAGNOSIS — M25612 Stiffness of left shoulder, not elsewhere classified: Secondary | ICD-10-CM | POA: Diagnosis not present

## 2020-01-28 DIAGNOSIS — M25512 Pain in left shoulder: Secondary | ICD-10-CM

## 2020-01-28 DIAGNOSIS — M6281 Muscle weakness (generalized): Secondary | ICD-10-CM

## 2020-01-28 NOTE — Therapy (Signed)
[place]. [place], [place], [address] Phone: [phone]   Fax:  [phone]  Physical Therapy Treatment  Patient Details  Name: Felicia Barron MRN: [id number] Date of Birth: [birth year] Referring Provider (PT): Bernerd Limbo, MD   Encounter Date: 01/28/2020   PT End of Session - 01/28/20 0933    Visit Number 20    Date for PT Re-Evaluation 02/04/20    PT Start Time 0845    PT Stop Time 0928    PT Time Calculation (min) 43 min    Activity Tolerance Patient tolerated treatment well;Patient limited by pain    Behavior During Therapy [facility] for tasks assessed/performed           Past Medical History:  Diagnosis Date  . Anemia   . Migraine   . Skin cancer     Past Surgical History:  Procedure Laterality Date  . CESAREAN SECTION    . CHOLECYSTECTOMY    . WISDOM TOOTH EXTRACTION      There were no vitals filed for this visit.   Subjective Assessment - 01/28/20 0848    Subjective Shoulder is sore from last rx    Currently in Pain? Yes    Pain Score 3     Pain Location Shoulder    Pain Orientation Left              [facility] PT Assessment - 01/28/20 0001      AROM   Left Shoulder Flexion 100 Degrees    Left Shoulder ABduction 90 Degrees      PROM   Left Shoulder Flexion 105 Degrees    Left Shoulder ABduction 95 Degrees                         [facility] Adult PT Treatment/Exercise - 01/28/20 0001      Shoulder Exercises: Supine   Other Supine Exercises 2# chest press into flex 2x10      Shoulder Exercises: ROM/Strengthening   UBE (Upper Arm Bike) L3 3 min fwd/3 min bkwd      Shoulder Exercises: Stretch   Table Stretch - Flexion 5 reps;10 seconds    Table Stretch - Abduction 5 reps;10 seconds                    PT Short Term Goals - 10/28/19 1129      PT SHORT TERM GOAL #1   Title Pt will be independent and compliant with initial HEP.    Time 1    Period Weeks     Status Achieved    Target Date 10/21/19      PT SHORT TERM GOAL #2   Title Pt will increase shoulder flexion/abduction ROM at least 10 each.    Time 3    Period Weeks    Status Partially Met    Target Date 11/04/19      PT SHORT TERM GOAL #3   Title Pt will only wake up 1-2x/night secondary to L shoulder pain.    Baseline 3-4x    Time 3    Period Weeks    Status On-going    Target Date 11/04/19             PT Long Term Goals - 01/28/20 0939      PT LONG TERM GOAL #1   Title Pt will decrease FOTO disability to 37%, demonstrating a significant change in perceived functional ability.  Baseline 66% disability    Time 6    Period Weeks    Status On-going      PT LONG TERM GOAL #2   Title Pt will increase true flexion/abduction ROM to >130 with <2/10 pain at most.    Baseline ROM decreasing    Time 6    Period Weeks    Status On-going      PT LONG TERM GOAL #3   Title Pt will increase L shoulder MMT to 4+/5 without pain.    Baseline MMT innacurate d/t pain    Time 6    Period Weeks    Status On-going      PT LONG TERM GOAL #4   Title Pt will be able to perform all ADLs independently, including don/doff bra and zipping up shirts/dresses.    Baseline husband helps    Time 6    Period Weeks    Status On-going      PT LONG TERM GOAL #5   Title Pt will sleep through the night without being awoken with L shoulder pain.    Time 6    Period Weeks    Status Achieved                 Plan - 01/28/20 0936    Clinical Impression Statement Pt demos some progress towards goals; active shoulder ROM has improved since last assessment. AROM and PROM of L shoulder is significantly limited. Pt reports some increased ability to perform ADLs such as putting up hair. Discussed with pt reducing frequency to 1x/week and focusing on updating/establishing HEP. Pt will be d/c in a few weeks with updated HEP to maintain current shoulder ROM.    PT Treatment/Interventions  ADLs/Self Care Home Management;Joint Manipulations;Spinal Manipulations;Vasopneumatic Device;Manual techniques;Passive range of motion;Patient/family education;Neuromuscular re-education;Therapeutic activities;Therapeutic exercise;Cryotherapy;Moist Heat    PT Next Visit Plan update HEP    Consulted and Agree with Plan of Care Patient           Patient will benefit from skilled therapeutic intervention in order to improve the following deficits and impairments:  Decreased mobility, Decreased strength, Impaired flexibility, Hypomobility, Impaired perceived functional ability, Pain, Impaired UE functional use, Decreased range of motion, Increased fascial restricitons  Visit Diagnosis: Stiffness of left shoulder, not elsewhere classified  Muscle weakness (generalized)  Acute pain of left shoulder     Problem List There are no problems to display for this patient.  Amador Cunas, PT, DPT Donald Prose Lotoya Casella 01/28/2020, 9:42 AM  [place]. [place], [place], [address] Phone: [phone]   Fax:  [phone]  Name: Shallyn Constancio MRN: [id number] Date of Birth: [birth year]

## 2020-02-08 ENCOUNTER — Other Ambulatory Visit: Payer: Self-pay

## 2020-02-08 ENCOUNTER — Encounter: Payer: Self-pay | Admitting: Physical Therapy

## 2020-02-08 ENCOUNTER — Ambulatory Visit: Payer: BC Managed Care – PPO | Admitting: Physical Therapy

## 2020-02-08 DIAGNOSIS — M25612 Stiffness of left shoulder, not elsewhere classified: Secondary | ICD-10-CM

## 2020-02-08 DIAGNOSIS — M6281 Muscle weakness (generalized): Secondary | ICD-10-CM

## 2020-02-08 DIAGNOSIS — S46912A Strain of unspecified muscle, fascia and tendon at shoulder and upper arm level, left arm, initial encounter: Secondary | ICD-10-CM

## 2020-02-08 NOTE — Therapy (Signed)
[place]. [place], [place], [address] Phone: [phone]   Fax:  [phone]  Physical Therapy Treatment  Patient Details  Name: Felicia Barron MRN: [id number] Date of Birth: 06-Sep-1965 Referring Provider (PT): Bernerd Limbo, MD   Encounter Date: 02/08/2020   PT End of Session - 02/08/20 1157    Visit Number 21    Date for PT Re-Evaluation 02/04/20    PT Start Time 4174    PT Stop Time 1229    PT Time Calculation (min) 44 min    Activity Tolerance Patient limited by pain    Behavior During Therapy [facility] for tasks assessed/performed           Past Medical History:  Diagnosis Date  . Anemia   . Migraine   . Skin cancer     Past Surgical History:  Procedure Laterality Date  . CESAREAN SECTION    . CHOLECYSTECTOMY    . WISDOM TOOTH EXTRACTION      There were no vitals filed for this visit.   Subjective Assessment - 02/08/20 1153    Subjective Shoulder is doing okay today. Tried to go without taking tramadol for two days straight and was in terrible pain. She called the Dr. and has an appointment with a shoulder specialist next Wednesday.    Currently in Pain? Yes    Pain Score 3                              [facility] Adult PT Treatment/Exercise - 02/08/20 0001      Shoulder Exercises: Supine   Other Supine Exercises AAROM #3 chest press, chest press into flex 10x3",     Other Supine Exercises AAROM #3 DB flex/abd.    PTA assist during concentric motion     Shoulder Exercises: Standing   Other Standing Exercises wall slides x10 flex/abd. x10 ea, finger ladder x7      Shoulder Exercises: ROM/Strengthening   UBE (Upper Arm Bike) L3 2 mins ea way    Nustep L2 19mins      Shoulder Exercises: Stretch   Internal Rotation Stretch 10 seconds    Internal Rotation Stretch Limitations 10x10" with strap                    PT Short Term Goals - 10/28/19 1129      PT SHORT  TERM GOAL #1   Title Pt will be independent and compliant with initial HEP.    Time 1    Period Weeks    Status Achieved    Target Date 10/21/19      PT SHORT TERM GOAL #2   Title Pt will increase shoulder flexion/abduction ROM at least 10 each.    Time 3    Period Weeks    Status Partially Met    Target Date 11/04/19      PT SHORT TERM GOAL #3   Title Pt will only wake up 1-2x/night secondary to L shoulder pain.    Baseline 3-4x    Time 3    Period Weeks    Status On-going    Target Date 11/04/19             PT Long Term Goals - 01/28/20 0939      PT LONG TERM GOAL #1   Title Pt will decrease FOTO disability to 37%, demonstrating a significant change in perceived functional  ability.    Baseline 66% disability    Time 6    Period Weeks    Status On-going      PT LONG TERM GOAL #2   Title Pt will increase true flexion/abduction ROM to >130 with <2/10 pain at most.    Baseline ROM decreasing    Time 6    Period Weeks    Status On-going      PT LONG TERM GOAL #3   Title Pt will increase L shoulder MMT to 4+/5 without pain.    Baseline MMT innacurate d/t pain    Time 6    Period Weeks    Status On-going      PT LONG TERM GOAL #4   Title Pt will be able to perform all ADLs independently, including don/doff bra and zipping up shirts/dresses.    Baseline husband helps    Time 6    Period Weeks    Status On-going      PT LONG TERM GOAL #5   Title Pt will sleep through the night without being awoken with L shoulder pain.    Time 6    Period Weeks    Status Achieved                 Plan - 02/08/20 1158    Clinical Impression Statement Patient was very limited in treatment today by pain. PTA had to assist with a lot of movments to get full available ROM. During S/L AROM abduction patient was able to gain about 10-20 more deg when PTA assisted scapula in upward rotation.    PT Treatment/Interventions ADLs/Self Care Home Management;Joint  Manipulations;Spinal Manipulations;Vasopneumatic Device;Manual techniques;Passive range of motion;Patient/family education;Neuromuscular re-education;Therapeutic activities;Therapeutic exercise;Cryotherapy;Moist Heat    PT Next Visit Plan follow up after patient sees shoulder specialist Wed. of next week (12/8)           Patient will benefit from skilled therapeutic intervention in order to improve the following deficits and impairments:  Decreased mobility, Decreased strength, Impaired flexibility, Hypomobility, Impaired perceived functional ability, Pain, Impaired UE functional use, Decreased range of motion, Increased fascial restricitons  Visit Diagnosis: Stiffness of left shoulder, not elsewhere classified  Muscle weakness (generalized)  Strain of left shoulder, initial encounter     Problem List There are no problems to display for this patient.   Lavenia Atlas, SPTA 02/08/2020, 12:31 PM  [place]. [place], [place], [address] Phone: [phone]   Fax:  [phone]  Name: Felicia Barron MRN: [id number] Date of Birth: [birth year]
